# Patient Record
Sex: Male | Born: 1970 | Race: White | Hispanic: No | Marital: Single | Smoking: Never smoker
Health system: Southern US, Community
[De-identification: ages and names within clinical notes are randomized; demographics above are authoritative.]

## PROBLEM LIST (undated history)

## (undated) DIAGNOSIS — I10 Essential (primary) hypertension: Secondary | ICD-10-CM

## (undated) DIAGNOSIS — G4730 Sleep apnea, unspecified: Secondary | ICD-10-CM

## (undated) HISTORY — DX: Sleep apnea, unspecified: G47.30

## (undated) HISTORY — DX: Essential (primary) hypertension: I10

## (undated) HISTORY — PX: FOOT SURGERY: SHX648

---

## 2013-02-06 NOTE — Discharge Instructions (Signed)
Have 4 sutures removed in 5-7 days  Laceration Care, Adult  A laceration is a cut or lesion that goes through all layers of the skin and into the tissue just beneath the skin.  TREATMENT   Some lacerations may not require closure. Some lacerations may not be able to be closed due to an increased risk of infection. It is important to see your caregiver as soon as possible after an injury to minimize the risk of infection and maximize the opportunity for successful closure.  If closure is appropriate, pain medicines may be given, if needed. The wound will be cleaned to help prevent infection. Your caregiver will use stitches (sutures), staples, wound glue (adhesive), or skin adhesive strips to repair the laceration. These tools bring the skin edges together to allow for faster healing and a better cosmetic outcome. However, all wounds will heal with a scar. Once the wound has healed, scarring can be minimized by covering the wound with sunscreen during the day for 1 full year.  HOME CARE INSTRUCTIONS   For sutures or staples:   Keep the wound clean and dry.   If you were given a bandage (dressing), you should change it at least once a day. Also, change the dressing if it becomes wet or dirty, or as directed by your caregiver.   Wash the wound with soap and water 2 times a day. Rinse the wound off with water to remove all soap. Pat the wound dry with a clean towel.   After cleaning, apply a thin layer of the antibiotic ointment as recommended by your caregiver. This will help prevent infection and keep the dressing from sticking.   You may shower as usual after the first 24 hours. Do not soak the wound in water until the sutures are removed.   Only take over-the-counter or prescription medicines for pain, discomfort, or fever as directed by your caregiver.   Get your sutures or staples removed as directed by your caregiver.  For skin adhesive strips:   Keep the wound clean and dry.   Do not get the skin  adhesive strips wet. You may bathe carefully, using caution to keep the wound dry.   If the wound gets wet, pat it dry with a clean towel.   Skin adhesive strips will fall off on their own. You may trim the strips as the wound heals. Do not remove skin adhesive strips that are still stuck to the wound. They will fall off in time.  For wound adhesive:   You may briefly wet your wound in the shower or bath. Do not soak or scrub the wound. Do not swim. Avoid periods of heavy perspiration until the skin adhesive has fallen off on its own. After showering or bathing, gently pat the wound dry with a clean towel.   Do not apply liquid medicine, cream medicine, or ointment medicine to your wound while the skin adhesive is in place. This may loosen the film before your wound is healed.   If a dressing is placed over the wound, be careful not to apply tape directly over the skin adhesive. This may cause the adhesive to be pulled off before the wound is healed.   Avoid prolonged exposure to sunlight or tanning lamps while the skin adhesive is in place. Exposure to ultraviolet light in the first year will darken the scar.   The skin adhesive will usually remain in place for 5 to 10 days, then naturally fall off the skin.  Do not pick at the adhesive film.  You may need a tetanus shot if:   You cannot remember when you had your last tetanus shot.   You have never had a tetanus shot.  If you get a tetanus shot, your arm may swell, get red, and feel warm to the touch. This is common and not a problem. If you need a tetanus shot and you choose not to have one, there is a rare chance of getting tetanus. Sickness from tetanus can be serious.  SEEK MEDICAL CARE IF:    You have redness, swelling, or increasing pain in the wound.   You see a red line that goes away from the wound.   You have yellowish-white fluid (pus) coming from the wound.   You have a fever.   You notice a bad smell coming from the wound or  dressing.   Your wound breaks open before or after sutures have been removed.   You notice something coming out of the wound such as wood or glass.   Your wound is on your hand or foot and you cannot move a finger or toe.  SEEK IMMEDIATE MEDICAL CARE IF:    Your pain is not controlled with prescribed medicine.   You have severe swelling around the wound causing pain and numbness or a change in color in your arm, hand, leg, or foot.   Your wound splits open and starts bleeding.   You have worsening numbness, weakness, or loss of function of any joint around or beyond the wound.   You develop painful lumps near the wound or on the skin anywhere on your body.  MAKE SURE YOU:    Understand these instructions.   Will watch your condition.   Will get help right away if you are not doing well or get worse.  Document Released: 09/21/2005 Document Revised: 12/14/2011 Document Reviewed: 03/17/2011  Exit[employer] Patient Information 2013 [employer].

## 2013-02-06 NOTE — ED Notes (Signed)
Visual acuity complete at this time. PA aware at this time. Pt resting in bed, resp easy. Call light within reach. Bed alarm engaged. Will cont to monitor.     Basilia Jumbo, RN  02/06/13 [phone]

## 2013-02-06 NOTE — ED Provider Notes (Signed)
HPI     [facility] ED  Emergency Department Encounter    Pt Name: Cody Shaw  MRN: [id number]  Birthdate [birth year]  Date of evaluation: 02/06/2013  Provider: Dalene Carrow, PA-C    Chief Complaint   Patient presents with   ??? Laceration     was at work, states air hose popped off and hit him in the eyelid. laceration to right eyelid. no active bleeding       Nursing Notes, Past Medical Hx, Past Surgical Hx, Social Hx, Allergies, and Family Hx were all reviewed and agreed with or any disagreements were addressed in the HPI.    HPI:  (Location, Duration, Timing, Severity, Quality, Assoc Sx, Context, Modifying factors)  This is a  42 y.o. male that presents to the emergency department right after a air hose popped off grazing him and hit him above his eye.  Patient states he had safety glasses on and he states that he got knocked off.  He denies actually anything hitting his eye.  States he is having blurry vision in his right eye due to the amount of tearing and states he has a small amount of pain a 5/10 and a side.  Denies headache, dizziness, nausea, vomiting.  Was driven here by his supervisor.    Past Medical/Surgical History:  History reviewed. No pertinent past medical history.      Procedure Laterality Date   ??? Umbilical hernia repair     ??? Cholecystectomy     ??? Inguinal hernia repair     ??? Knee surgery       left       Medications:  No current facility-administered medications on file prior to encounter.     No current outpatient prescriptions on file prior to encounter.       Review of Systems   Constitutional: Negative for fever and chills.   Eyes: Positive for discharge. Negative for photophobia.   Gastrointestinal: Negative for nausea and vomiting.   Skin: Positive for wound.   Neurological: Negative for headaches.   Remainder of ROS negative as it pertains to the Chief Complaint.      Physical Exam   Nursing note and vitals reviewed.  Constitutional: He is oriented to person, place,  and time. He appears well-developed and well-nourished.   Patient sitting comfortably in bed.  Nontoxic in appearance.   HENT:   Head: Atraumatic.   Right Ear: External ear normal.   Mouth/Throat: Oropharynx is clear and moist. No oropharyngeal exudate.   There is a 2.5 cm linear laceration just below the right eyebrow.   Eyes: EOM are normal. Pupils are equal, round, and reactive to light. Right eye exhibits no discharge. Left eye exhibits no discharge.   Mild clear tearing to the right eye.  No hyphema, hypopyon or injection.  Fluorescein exam reveals no uptake or evidence of abrasion.  Negative Seidel   Neck: Normal range of motion.   Cardiovascular: Normal rate.    Neurological: He is alert and oriented to person, place, and time. No cranial nerve deficit.   Skin: Skin is warm and dry. No rash noted. He is not diaphoretic. No erythema.   Psychiatric: He has a normal mood and affect. His behavior is normal.       Lac Repair  Date/Time: 02/06/2013 11:28 PM  Performed by: Dalene Carrow  Authorized by: Sabino Niemann T  Consent: Verbal consent obtained.  Risks and benefits: risks, benefits and alternatives were discussed  Consent given by: patient  Patient identity confirmed: verbally with patient  Body area: head/neck  Location details: right eyelid  Laceration length: 2.5 cm  Foreign bodies: no foreign bodies  Tendon involvement: none  Nerve involvement: none  Vascular damage: no  Anesthesia: local infiltration  Local anesthetic: lidocaine 1% with epinephrine  Anesthetic total: 4 ml  Patient sedated: no  Preparation: Patient was prepped and draped in the usual sterile fashion.  Irrigation solution: saline  Irrigation method: jet lavage  Amount of cleaning: standard  Debridement: none  Degree of undermining: none  Skin closure: 6-0 nylon  Number of sutures: 4  Technique: simple  Approximation: close  Approximation difficulty: simple  Patient tolerance: Patient tolerated the procedure well with no immediate  complications.        MDM  MEDICAL DECISION MAKING    Vitals:    Filed Vitals:    02/06/13 2159   BP: 150/73   Pulse: 99   Temp: 97.8 ??F (36.6 ??C)   TempSrc: Oral   Resp: 16   Height: 5\' 9"  (1.753 m)   Weight: 242 lb (109.77 kg)   SpO2: 96%       LABS: Labs Reviewed - No data to display     Remainder of labs reviewed and were negative at this time or not returned at the time of this note.    Orders:  Orders Placed This Encounter   Procedures   ??? LACERATION REPAIR   ??? Visual acuity screening       EKG: Interpreted by the attending and myself in the absence of a real-time interpretation by the cardiologist.  N/A    X-RAYS: The attending, Bo Merino, MD, and I have viewed the radiologic plain film image(s).  The plain films will be read or overread by the radiologist.  ALL OTHER NON-PLAIN FILM IMAGES SUCH AS CT, ULTRASOUND AND MRI HAVE BEEN READ BY THE RADIOLOGIST.        No results found.     PROCEDURES: N/A    CRITICAL CARE:  N/A    CONSULTATIONS: N/A    MEDICAL DECISION MAKING / ED COURSE:    Patient was given the following medications:  Medications - No data to display    That presents to the emergency department with a facial laceration blurry vision in his right eye.  As the patient sat here in the emergency department he states his blurry vision had improved.  I do not suspect globe rupture.  There is no evidence of foreign body or corneal abrasion.  Patient is 20/20 bilaterally.  Facial laceration was closed with 4 simple sutures the patient is educated on these removed in 5-7 days.  Patient is neurologically intact.  Patient is stable for outpatient followup and treatment.  He understood his instructions at discharge and is stable at discharge.    The patient tolerated their visit well.  They were seen and evaluated by the attending physician who agreed with the assessment and plan.  The patient and / or the family were informed of the results of any tests, a time was given to answer questions, a plan was  proposed and they agreed with plan.      CLINICAL IMPRESSION:    1. Facial laceration, initial encounter        DISPOSITION Decision to Discharge    PATIENT REFERRED TO:  [facility]  [address]  [place] [address]  [phone]    Schedule an appointment as  soon as possible for a visit in 1 day        DISCHARGE MEDICATIONS:  There are no discharge medications for this patient.      (Please note the MDM and HPI sections of this note were completed with a voice recognition program.  Efforts were made to edit the dictations but occasionally words are mis-transcribed.)    Dalene Carrow, PA-C          Dalene Carrow, PA-C  02/07/13 0401

## 2013-02-06 NOTE — ED Notes (Signed)
Pt resting in bed, resp easy. Call light within reach. Bed alarm engaged. PA at the bedside. Will cont to monitor.     Basilia Jumbo, RN  02/06/13 [phone]

## 2013-02-06 NOTE — ED Provider Notes (Signed)
Patient was struck right above the right eye the symptoms were high-pressure hose. He was wearing glasses and denies that the pressure or the device at the globe. His little tearing of the eye. Pupil appeared equally round to me. We will do fluorescein staining to look for Seidel test. There is no injection to the globe no hyphema. The small laceration above the right eyebrow but no bony tenderness. Normal neurologic exam.      I saw and evaluated this patient shortly after they were seen by the PA/NP 2255    I have personally seen and examined this patient and agree with all pertinent clinical information. I have also reviewed the mid-level provider's documentation and agree with the past medical, family and social history unless otherwise noted.      Bo Merino, MD  02/06/13 2255

## 2013-02-07 ENCOUNTER — Inpatient Hospital Stay: Admit: 2013-02-07 | Discharge: 2013-02-07 | Attending: Emergency Medicine

## 2013-02-07 MED ORDER — LIDOCAINE-EPINEPHRINE 1 %-1:100000 IJ SOLN
1 %-:00000 | INTRAMUSCULAR | Status: DC
Start: 2013-02-07 — End: 2013-02-07

## 2013-02-07 MED ORDER — FLUORESCEIN SODIUM 1 MG OP STRP
1 MG | OPHTHALMIC | Status: DC
Start: 2013-02-07 — End: 2013-02-07

## 2013-02-07 MED ORDER — BSS PLUS IO SOLN
INTRAOCULAR | Status: DC
Start: 2013-02-07 — End: 2013-02-07

## 2013-02-07 MED ORDER — PROPARACAINE HCL 0.5 % OP SOLN
0.5 % | OPHTHALMIC | Status: DC
Start: 2013-02-07 — End: 2013-02-07

## 2013-02-07 MED FILL — PROPARACAINE HCL 0.5 % OP SOLN: 0.5 % | OPHTHALMIC | Qty: 15

## 2013-02-07 MED FILL — LIDOCAINE-EPINEPHRINE 1 %-1:100000 IJ SOLN: 1 %-:00000 | INTRAMUSCULAR | Qty: 20

## 2013-02-07 MED FILL — BSS IO SOLN: INTRAOCULAR | Qty: 15

## 2013-02-07 MED FILL — FUL-GLO 1 MG OP STRP: 1 MG | OPHTHALMIC | Qty: 1

## 2016-01-31 ENCOUNTER — Encounter: Payer: Self-pay | Admitting: Cardiovascular Disease

## 2016-02-10 ENCOUNTER — Ambulatory Visit: Payer: Self-pay | Admitting: Cardiovascular Disease

## 2016-02-12 ENCOUNTER — Encounter: Payer: Self-pay | Admitting: *Deleted

## 2016-02-13 ENCOUNTER — Encounter: Payer: Self-pay | Admitting: *Deleted

## 2016-02-13 ENCOUNTER — Encounter: Payer: Self-pay | Admitting: Cardiovascular Disease

## 2016-02-13 ENCOUNTER — Ambulatory Visit (INDEPENDENT_AMBULATORY_CARE_PROVIDER_SITE_OTHER): Payer: Self-pay | Admitting: Cardiovascular Disease

## 2016-02-13 VITALS — BP 151/87 | HR 77 | Ht 74.0 in | Wt >= 6400 oz

## 2016-02-13 DIAGNOSIS — R635 Abnormal weight gain: Secondary | ICD-10-CM

## 2016-02-13 DIAGNOSIS — I10 Essential (primary) hypertension: Secondary | ICD-10-CM

## 2016-02-13 DIAGNOSIS — R0602 Shortness of breath: Secondary | ICD-10-CM | POA: Insufficient documentation

## 2016-02-13 DIAGNOSIS — R5383 Other fatigue: Secondary | ICD-10-CM

## 2016-02-13 DIAGNOSIS — R42 Dizziness and giddiness: Secondary | ICD-10-CM

## 2016-02-13 MED ORDER — LOSARTAN POTASSIUM 50 MG PO TABS: 75.0000 mg | ORAL_TABLET | Freq: Every day | ORAL | Status: AC

## 2016-02-13 NOTE — Progress Notes (Signed)
Patient ID: Stephen Barton, male   DOB: 06-May-1971, 45 y.o.   MRN: [id number]       CARDIOLOGY CONSULT NOTE  Patient ID: Stephen Barton MRN: [id number] DOB/AGE: February 02, 1971 45 y.o.  Admit date: (Not on file) Primary Physician: Kirstie Peri, MD Referring Physician: Sherryll Burger MD  Reason for Consultation: SOB  HPI: The patient is a 45 year old male with a history of morbid obesity and hypertension who has been referred for the evaluation of shortness of breath.  He weighs 447 pounds. He said that in the last one year he has had a 65-70 pound weight gain. He said he does not eat as much as I would think. He takes his blood pressure medications in the evening. Blood pressure is 151/87 this morning. He has been having progressive exertional dyspnea over the past 8 months. Denies exertional chest pain. Even when showering he experiences such shortness of breath that it makes him dizzy. Denies leg swelling and syncope. Also denies orthopnea and paroxysmal nocturnal dyspnea.  He has a history of intravenous drug use and last used about 3 months ago. He has been in a methadone clinic for the past 7 years.  ECG performed in the office today which I personally interpreted demonstrated sinus rhythm with a diffuse nonspecific T wave abnormality.   No Known Allergies  Current Outpatient Prescriptions  Medication Sig Dispense Refill  . aspirin (ASPIRIN EC) 81 MG EC tablet Take 81 mg by mouth daily. Swallow whole.    . diclofenac (VOLTAREN) 75 MG EC tablet Take 75 mg by mouth 2 (two) times daily.    Marland Kitchen FLUoxetine (PROZAC) 20 MG capsule Take 20 mg by mouth daily.    . furosemide (LASIX) 40 MG tablet Take 40 mg by mouth daily as needed.    . gabapentin (NEURONTIN) 300 MG capsule Take 300 mg by mouth 3 (three) times daily.    Marland Kitchen losartan (COZAAR) 50 MG tablet Take 50 mg by mouth daily.    . 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    . potassium chloride (K-DUR) 10 MEQ tablet Take  10 mEq by mouth daily as needed.    . sildenafil (VIAGRA) 100 MG tablet Take 100 mg by mouth daily as needed for erectile dysfunction.     No current facility-administered medications for this visit.    Past Medical History  Diagnosis Date  . Hypertension   . Sleep apnea     No past surgical history on file.  Social History   Social History  . Marital Status: Single    Spouse Name: N/A  . Number of Children: N/A  . Years of Education: N/A   Occupational History  . Not on file.   Social History Main Topics  . Smoking status: Never Smoker   . Smokeless tobacco: Never Used  . Alcohol Use: Not on file  . Drug Use: Not on file     Comment: goes to a methadone clinic; use to smoke pot and took pills; 07/06/14 smokes mj  . Sexual Activity: Not on file   Other Topics Concern  . Not on file   Social History Narrative     No family history of premature CAD in 1st degree relatives.  Prior to Admission medications   Medication Sig Start Date End Date Taking? Authorizing Provider  citalopram (CELEXA) 40 MG tablet Take 40 mg by mouth daily.    Historical Provider, MD  diclofenac (VOLTAREN) 75 MG EC tablet Take 75  mg by mouth 2 (two) times daily.    Historical Provider, MD  furosemide (LASIX) 40 MG tablet Take 40 mg by mouth daily as needed.    Historical Provider, MD  gabapentin (NEURONTIN) 300 MG capsule Take 300 mg by mouth 3 (three) times daily.    Historical Provider, MD  losartan (COZAAR) 50 MG tablet Take 50 mg by mouth daily.    Historical Provider, MD  methadone (METHADOSE) 40 MG disintegrating tablet Take 40 mg by mouth daily.    Historical Provider, MD  potassium chloride (K-DUR) 10 MEQ tablet Take 10 mEq by mouth daily as needed.    Historical Provider, MD  sildenafil (VIAGRA) 100 MG tablet Take 100 mg by mouth daily as needed for erectile dysfunction.    Historical Provider, MD     Review of systems complete and found to be negative unless listed above in  HPI     Physical exam Blood pressure 151/87, pulse 77, height 6\' 2"  (1.88 m), weight 447 lb (202.758 kg). General: NAD Neck: Unable to assess. Lungs: Clear to auscultation bilaterally with normal respiratory effort. CV: Nondisplaced PMI. Regular rate and rhythm, normal S1/S2, no S3/S4, no murmur.  No peripheral edema.  No carotid bruit.    Abdomen: Morbidly obese.  Skin: Intact without lesions or rashes.  Neurologic: Alert and oriented x 3.  Psych: Normal affect. Extremities: No clubbing or cyanosis.  HEENT: Normal.   ECG: Most recent ECG reviewed.  Labs:  No results found for: WBC, HGB, HCT, MCV, PLT No results for input(s): NA, K, CL, CO2, BUN, CREATININE, CALCIUM, PROT, BILITOT, ALKPHOS, ALT, AST, GLUCOSE in the last 168 hours.  Invalid input(s): LABALBU No results found for: CKTOTAL, CKMB, CKMBINDEX, TROPONINI No results found for: CHOL No results found for: HDL No results found for: LDLCALC No results found for: TRIG No results found for: CHOLHDL No results found for: LDLDIRECT       Studies: No results found.  ASSESSMENT AND PLAN:  1. Shortness of breath and fatigue: Likely due to obesity hypoventilation syndrome. I will order a 2-D echocardiogram with Doppler to evaluate cardiac structure, function, and regional wall motion. I will proceed with a nuclear myocardial perfusion imaging study (2-day Lexiscan) to evaluate for ischemic heart disease.  2. Morbid obesity: Should consider bariatric surgery. Will check TSH for rapid weight gain in past year.  3. Essential HTN: Given morbid obesity, he may have hypertensive heart disease as well. I will order a 2-D echocardiogram with Doppler to evaluate cardiac structure, function, and regional wall motion. BP is elevated. Will increase losartan to 75 mg daily and have him take it every morning.  Dispo: fu 6 weeks.  Signed: Prentice DockerSuresh Koneswaran, M.D., F.A.C.C.  02/13/2016, 10:52 AM

## 2016-02-13 NOTE — Patient Instructions (Addendum)
   Lab for TSH - order given today. Your physician has requested that you have an echocardiogram. Echocardiography is a painless test that uses sound waves to create images of your heart. It provides your doctor with information about the size and shape of your heart and how well your heart's chambers and valves are working. This procedure takes approximately one hour. There are no restrictions for this procedure. Your physician has requested that you have a lexiscan myoview. For further information please visit www.cardiosmart.org. Please follow [email or web address] sheet, as given.  (2 DAY TEST) Office will contact with results via phone or letter.   Increase Losartan to 75mg  every morning -[facility]. Continue all other medications.   Follow up in  6 weeks.

## 2016-02-13 NOTE — Addendum Note (Signed)
Addended by: Lesle ChrisHILL, Xana Bradt G on: 02/13/2016 01:09 PM   Modules accepted: Orders

## 2016-02-13 NOTE — Addendum Note (Signed)
Addended by: Lesle ChrisHILL, Newt Levingston G on: 02/13/2016 11:44 AM   Modules accepted: Orders

## 2016-02-18 ENCOUNTER — Encounter: Payer: Self-pay | Admitting: *Deleted

## 2016-02-19 ENCOUNTER — Encounter (HOSPITAL_COMMUNITY): Admission: RE | Admit: 2016-02-19 | Payer: Self-pay | Source: Ambulatory Visit

## 2016-02-20 ENCOUNTER — Encounter (HOSPITAL_COMMUNITY): Payer: Self-pay

## 2016-02-20 ENCOUNTER — Ambulatory Visit (HOSPITAL_COMMUNITY): Payer: Self-pay

## 2016-02-26 ENCOUNTER — Encounter: Payer: Self-pay | Admitting: *Deleted

## 2016-03-11 ENCOUNTER — Telehealth (HOSPITAL_COMMUNITY): Payer: Self-pay | Admitting: *Deleted

## 2016-03-11 NOTE — Telephone Encounter (Signed)
Patient given detailed instructions per Myocardial Perfusion Study Information Sheet for the test on 03/16/16. Patient notified to arrive 15 minutes early and that it is imperative to arrive on time for appointment to keep from having the test rescheduled.  If you need to cancel or reschedule your appointment, please call the office within 24 hours of your appointment. Failure to do so may result in a cancellation of your appointment, and a $50 no show fee. Patient verbalized understanding. Mousa Prout J Brandyn Lowrey, RN  

## 2016-03-16 ENCOUNTER — Other Ambulatory Visit: Payer: Self-pay

## 2016-03-16 ENCOUNTER — Ambulatory Visit (HOSPITAL_COMMUNITY): Payer: Self-pay | Attending: Cardiovascular Disease

## 2016-03-16 ENCOUNTER — Ambulatory Visit (HOSPITAL_BASED_OUTPATIENT_CLINIC_OR_DEPARTMENT_OTHER): Payer: Self-pay

## 2016-03-16 DIAGNOSIS — R42 Dizziness and giddiness: Secondary | ICD-10-CM

## 2016-03-16 DIAGNOSIS — I517 Cardiomegaly: Secondary | ICD-10-CM | POA: Insufficient documentation

## 2016-03-16 DIAGNOSIS — R0602 Shortness of breath: Secondary | ICD-10-CM

## 2016-03-16 LAB — ECHOCARDIOGRAM COMPLETE
CHL CUP DOP CALC LVOT VTI: 24.8 cm
E decel time: 261 msec
EERAT: 4.82
IV/PV OW: 1.07
LA diam end sys: 53 mm
LA diam index: 1.57 cm/m2
LA vol A4C: 72.3 ml
LASIZE: 53 mm
LV E/e'average: 4.82
LV TDI E'LATERAL: 15.2
LV e' LATERAL: 15.2 cm/s
LVEEMED: 4.82
LVOT peak grad rest: 6 mmHg
LVOTPV: 123 cm/s
Lateral S' vel: 16.1 cm/s
MV Dec: 261
MV pk E vel: 73.2 m/s
MVPG: 2 mmHg
MVPKAVEL: 78.1 m/s
PW: 10.5 mm — AB (ref 0.6–1.1)
RV TAPSE: 24.6 mm
TDI e' medial: 15.4

## 2016-03-16 MED ORDER — PERFLUTREN LIPID MICROSPHERE
1.0000 mL | INTRAVENOUS | Status: AC | PRN
  Administered 2016-03-16: 2 mL via INTRAVENOUS

## 2016-03-16 MED ORDER — TECHNETIUM TC 99M TETROFOSMIN IV KIT
32.4000 | PACK | Freq: Once | INTRAVENOUS | Status: AC | PRN
  Administered 2016-03-16: 32 via INTRAVENOUS
  Filled 2016-03-16: qty 32

## 2016-03-16 MED ORDER — REGADENOSON 0.4 MG/5ML IV SOLN
0.4000 mg | Freq: Once | INTRAVENOUS | Status: AC
  Administered 2016-03-16: 0.4 mg via INTRAVENOUS

## 2016-03-17 ENCOUNTER — Ambulatory Visit (HOSPITAL_COMMUNITY): Payer: Self-pay | Attending: Internal Medicine

## 2016-03-17 ENCOUNTER — Telehealth: Payer: Self-pay | Admitting: *Deleted

## 2016-03-17 LAB — MYOCARDIAL PERFUSION IMAGING
LV dias vol: 160 mL (ref 62–150)
LV sys vol: 61 mL
Peak HR: 91 {beats}/min
RATE: 0.4
Rest HR: 72 {beats}/min
SDS: 2
SRS: 3
SSS: 5
TID: 1.3

## 2016-03-17 MED ORDER — TECHNETIUM TC 99M TETROFOSMIN IV KIT
33.0000 | PACK | Freq: Once | INTRAVENOUS | Status: AC | PRN
  Administered 2016-03-17: 33 via INTRAVENOUS
  Filled 2016-03-17: qty 33

## 2016-03-17 NOTE — Telephone Encounter (Signed)
Notes Recorded by Lesle ChrisAngela G Ara Mano, LPN on 1/61/09606/13/2017 at 9:54 AM Patient notified and verbalized understanding. Copy to pmd. Follow up already scheduled for 03/31/2016.  Notes Recorded by Laqueta LindenSuresh A Koneswaran, MD on 03/16/2016 at 3:55 PM Normal pumping function.

## 2016-03-18 ENCOUNTER — Telehealth: Payer: Self-pay | Admitting: *Deleted

## 2016-03-18 NOTE — Telephone Encounter (Signed)
-----   Message from Stephen PocheJonathan F Branch, MD sent at 03/18/2016 11:16 AM EDT ----- Patient of Dr Kirtland BouchardK, stress test looks good without evidence of blockages. Will discuss in detatil at his f/u with Dr Kirtland BouchardK later this month  Dominga FerryJ Branch MD

## 2016-03-18 NOTE — Telephone Encounter (Signed)
Patient informed. 

## 2016-03-31 ENCOUNTER — Ambulatory Visit: Payer: Self-pay | Admitting: Cardiovascular Disease

## 2016-04-20 ENCOUNTER — Ambulatory Visit (INDEPENDENT_AMBULATORY_CARE_PROVIDER_SITE_OTHER): Payer: Self-pay | Admitting: Cardiovascular Disease

## 2016-04-20 ENCOUNTER — Encounter: Payer: Self-pay | Admitting: Cardiovascular Disease

## 2016-04-20 VITALS — BP 168/80 | HR 88 | Ht 74.0 in | Wt >= 6400 oz

## 2016-04-20 DIAGNOSIS — Z136 Encounter for screening for cardiovascular disorders: Secondary | ICD-10-CM

## 2016-04-20 DIAGNOSIS — IMO0001 Reserved for inherently not codable concepts without codable children: Secondary | ICD-10-CM

## 2016-04-20 DIAGNOSIS — Z7182 Exercise counseling: Secondary | ICD-10-CM

## 2016-04-20 DIAGNOSIS — Z7189 Other specified counseling: Secondary | ICD-10-CM

## 2016-04-20 DIAGNOSIS — R0602 Shortness of breath: Secondary | ICD-10-CM

## 2016-04-20 DIAGNOSIS — I10 Essential (primary) hypertension: Secondary | ICD-10-CM

## 2016-04-20 NOTE — Patient Instructions (Signed)
Medication Instructions:  Continue all current medications.  Labwork: NONE  Testing/Procedures: NONE  Follow-Up: AS NEEDED  Any Other Special Instructions Will Be Listed Below (If Applicable).  If you need a refill on your cardiac medications before your next appointment, please call your pharmacy.  

## 2016-04-20 NOTE — Progress Notes (Signed)
Patient ID: Stephen Barton, male   DOB: 05/18/1971, 45 y.o.   MRN: [id number]      SUBJECTIVE: The patient returns for follow-up after undergoing cardiovascular testing performed for the evaluation of SOB. Nuclear stress test 03/16/16 was low risk with normal perfusion. Echocardiogram showed normal left ventricular systolic and diastolic function and normal regional wall motion, EF 55-60%.   Review of Systems: As per "subjective", otherwise negative.  No Known Allergies  Current Outpatient Prescriptions  Medication Sig Dispense Refill  . aspirin (ASPIRIN EC) 81 MG EC tablet Take 81 mg by mouth daily. Swallow whole.    . diclofenac (VOLTAREN) 75 MG EC tablet Take 75 mg by mouth 2 (two) times daily.    Marland Kitchen. FLUoxetine (PROZAC) 20 MG capsule Take 20 mg by mouth daily.    . furosemide (LASIX) 40 MG tablet Take 40 mg by mouth daily as needed.    . gabapentin (NEURONTIN) 300 MG capsule Take 300 mg by mouth 3 (three) times daily.    Marland Kitchen. losartan (COZAAR) 50 MG tablet Take 1.5 tablets (75 mg total) by mouth daily. 45 tablet 6  . 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    . potassium chloride (K-DUR) 10 MEQ tablet Take 10 mEq by mouth daily as needed.    . sildenafil (VIAGRA) 100 MG tablet Take 100 mg by mouth daily as needed for erectile dysfunction.     No current facility-administered medications for this visit.    Past Medical History  Diagnosis Date  . Hypertension   . Sleep apnea     Past Surgical History  Procedure Laterality Date  . Foot surgery      X's    Social History   Social History  . Marital Status: Single    Spouse Name: N/A  . Number of Children: N/A  . Years of Education: N/A   Occupational History  . Not on file.   Social History Main Topics  . Smoking status: Never Smoker   . Smokeless tobacco: Never Used  . Alcohol Use: Not on file  . 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 Sexual Activity: Not on file   Other Topics Concern  . Not on file   Social History Narrative     Filed Vitals:   04/20/16 1615  BP: 168/80  Pulse: 88  Height: 6\' 2"  (1.88 m)  Weight: 424 lb (192.325 kg)  SpO2: 93%    PHYSICAL EXAM General: NAD Neck: Unable to assess. Lungs: Clear to auscultation bilaterally with normal respiratory effort. CV: Nondisplaced PMI. Regular rate and rhythm, normal S1/S2, no S3/S4, no murmur. No peripheral edema. No carotid bruit.  Abdomen: Morbidly obese.  Skin: Intact without lesions or rashes.  Neurologic: Alert and oriented x 3.  Psych: Normal affect. Extremities: No clubbing or cyanosis.  HEENT: Normal.     ECG: Most recent ECG reviewed.      ASSESSMENT AND PLAN: 1. Shortness of breath and fatigue: Likely due to obesity hypoventilation syndrome. Normal cardiac stress test and LV function.  2. Morbid obesity: Should consider bariatric surgery. TSH was normal. Exercise counseling provided.  3. Essential HTN: BP is elevated but he ran out of losartan.  Dispo: fu prn.   Prentice DockerSuresh Koneswaran, M.D., F.A.C.C.

## 2017-02-17 ENCOUNTER — Encounter: Admit: 2017-02-17

## 2017-02-17 ENCOUNTER — Encounter

## 2017-02-17 ENCOUNTER — Inpatient Hospital Stay: Admit: 2017-02-17 | Discharge: 2017-02-18 | Source: Home / Self Care | Admitting: Emergency Medicine

## 2017-02-17 DIAGNOSIS — R0789 Other chest pain: Secondary | ICD-10-CM

## 2017-02-17 LAB — CBC WITH AUTO DIFFERENTIAL
Basophils %: 0.6 %
Basophils Absolute: 0 10*3/uL (ref 0.0–0.2)
Eosinophils %: 0.9 %
Eosinophils Absolute: 0.1 10*3/uL (ref 0.0–0.6)
Hematocrit: 46.7 % (ref 40.5–52.5)
Hemoglobin: 16.7 g/dL (ref 13.5–17.5)
Lymphocytes %: 22.5 %
Lymphocytes Absolute: 1.6 10*3/uL (ref 1.0–5.1)
MCH: 32 pg (ref 26.0–34.0)
MCHC: 35.9 g/dL (ref 31.0–36.0)
MCV: 89.1 fL (ref 80.0–100.0)
MPV: 8 fL (ref 5.0–10.5)
Monocytes %: 8.8 %
Monocytes Absolute: 0.6 10*3/uL (ref 0.0–1.3)
Neutrophils %: 67.2 %
Neutrophils Absolute: 4.8 10*3/uL (ref 1.7–7.7)
Platelets: 241 10*3/uL (ref 135–450)
RBC: 5.24 M/uL (ref 4.20–5.90)
RDW: 12.8 % (ref 12.4–15.4)
WBC: 7.2 10*3/uL (ref 4.0–11.0)

## 2017-02-17 LAB — COMPREHENSIVE METABOLIC PANEL
ALT: 38 U/L (ref 10–40)
AST: 22 U/L (ref 15–37)
Albumin/Globulin Ratio: 1.2 (ref 1.1–2.2)
Albumin: 4.6 g/dL (ref 3.4–5.0)
Alkaline Phosphatase: 54 U/L (ref 40–129)
Anion Gap: 10 (ref 3–16)
BUN: 11 mg/dL (ref 7–20)
CO2: 27 mmol/L (ref 21–32)
Calcium: 9.6 mg/dL (ref 8.3–10.6)
Chloride: 101 mmol/L (ref 99–110)
Creatinine: 1 mg/dL (ref 0.9–1.3)
GFR African American: >60 (ref >60)
GFR Non-African American: >60 (ref >60)
Globulin: 3.7 g/dL
Glucose: 105 mg/dL — ABNORMAL HIGH (ref 70–99)
Potassium: 4.3 mmol/L (ref 3.5–5.1)
Sodium: 138 mmol/L (ref 136–145)
Total Bilirubin: 0.7 mg/dL (ref 0.0–1.0)
Total Protein: 8.3 g/dL — ABNORMAL HIGH (ref 6.4–8.2)

## 2017-02-17 LAB — PROTIME-INR
INR: 0.97 (ref 0.85–1.15)
Protime: 11 s (ref 9.6–13.0)

## 2017-02-17 LAB — TROPONIN
Troponin: <0.01 ng/mL (ref <0.01)
Troponin: <0.01 ng/mL (ref <0.01)
Troponin: <0.01 ng/mL (ref <0.01)

## 2017-02-17 LAB — BRAIN NATRIURETIC PEPTIDE: Pro-BNP: 11 pg/mL (ref 0–124)

## 2017-02-17 LAB — APTT: aPTT: 29.5 s (ref 24.1–34.9)

## 2017-02-17 MED ORDER — IOPAMIDOL 76 % IV SOLN
76 % | Freq: Once | INTRAVENOUS | Status: AC | PRN
Start: 2017-02-17 — End: 2017-02-17
  Administered 2017-02-17: 17:00:00 75 mL via INTRAVENOUS

## 2017-02-17 MED ORDER — SODIUM CHLORIDE 0.9 % IV BOLUS
0.9 % | Freq: Once | INTRAVENOUS | Status: AC
Start: 2017-02-17 — End: 2017-02-17
  Administered 2017-02-17: 17:00:00 1000 mL via INTRAVENOUS

## 2017-02-17 MED ORDER — ASPIRIN 81 MG PO CHEW
81 MG | Freq: Once | ORAL | Status: AC
Start: 2017-02-17 — End: 2017-02-17
  Administered 2017-02-17: 16:00:00 324 mg via ORAL

## 2017-02-17 MED ORDER — MAGNESIUM HYDROXIDE 400 MG/5ML PO SUSP
400 MG/5ML | Freq: Every day | ORAL | Status: DC | PRN
Start: 2017-02-17 — End: 2017-02-18

## 2017-02-17 MED ORDER — NITROGLYCERIN 0.4 MG SL SUBL
0.4 MG | SUBLINGUAL | Status: DC | PRN
Start: 2017-02-17 — End: 2017-02-18

## 2017-02-17 MED ORDER — NORMAL SALINE FLUSH 0.9 % IV SOLN
0.9 % | INTRAVENOUS | Status: DC | PRN
Start: 2017-02-17 — End: 2017-02-18

## 2017-02-17 MED ORDER — ONDANSETRON HCL 4 MG/2ML IJ SOLN
4 MG/2ML | Freq: Four times a day (QID) | INTRAMUSCULAR | Status: DC | PRN
Start: 2017-02-17 — End: 2017-02-18

## 2017-02-17 MED ORDER — ENOXAPARIN SODIUM 40 MG/0.4ML SC SOLN
40 MG/0.4ML | Freq: Every evening | SUBCUTANEOUS | Status: DC
Start: 2017-02-17 — End: 2017-02-18
  Administered 2017-02-18: 40 mg via SUBCUTANEOUS

## 2017-02-17 MED ORDER — SODIUM CHLORIDE 0.9 % IV SOLN
0.9 % | INTRAVENOUS | Status: AC
Start: 2017-02-17 — End: 2017-02-18

## 2017-02-17 MED ORDER — NORMAL SALINE FLUSH 0.9 % IV SOLN
0.9 % | Freq: Two times a day (BID) | INTRAVENOUS | Status: DC
Start: 2017-02-17 — End: 2017-02-18
  Administered 2017-02-18: 10 mL via INTRAVENOUS

## 2017-02-17 MED ORDER — NITROGLYCERIN 2 % TD OINT
2 % | Freq: Four times a day (QID) | TRANSDERMAL | Status: DC
Start: 2017-02-17 — End: 2017-02-18

## 2017-02-17 MED ORDER — MORPHINE SULFATE 2 MG/ML IJ SOLN
2 MG/ML | INTRAMUSCULAR | Status: DC | PRN
Start: 2017-02-17 — End: 2017-02-18

## 2017-02-17 MED ORDER — ACETAMINOPHEN 325 MG PO TABS
325 MG | ORAL | Status: DC | PRN
Start: 2017-02-17 — End: 2017-02-18
  Administered 2017-02-17 (×2): 650 mg via ORAL

## 2017-02-17 MED ORDER — ACETAMINOPHEN 325 MG PO TABS
325 MG | ORAL | Status: AC
Start: 2017-02-17 — End: 2017-02-18

## 2017-02-17 MED ORDER — ASPIRIN 81 MG PO CHEW
81 MG | Freq: Every day | ORAL | Status: DC
Start: 2017-02-17 — End: 2017-02-18

## 2017-02-17 MED ORDER — NITROGLYCERIN 2 % TD OINT
2 % | Freq: Once | TRANSDERMAL | Status: AC
Start: 2017-02-17 — End: 2017-02-17
  Administered 2017-02-17: 17:00:00 1 [in_us] via TOPICAL

## 2017-02-17 MED FILL — ACETAMINOPHEN 325 MG PO TABS: 325 MG | ORAL | Qty: 2

## 2017-02-17 MED FILL — NITRO-BID 2 % TD OINT: 2 % | TRANSDERMAL | Qty: 1

## 2017-02-17 MED FILL — SODIUM CHLORIDE 0.9 % IV SOLN: 0.9 % | INTRAVENOUS | Qty: 1000

## 2017-02-17 MED FILL — ASPIRIN 81 MG PO CHEW: 81 MG | ORAL | Qty: 4

## 2017-02-17 NOTE — ED Notes (Signed)
Pt reports dizziness upon standing and noted bigeminy while heart rate elevated to 110s.  Upon return bigemeny resolved.      Venita LickLeslie A Skyler Carel, RN  02/17/17 1157

## 2017-02-17 NOTE — ED Notes (Signed)
Pharmacy Medication History Note      List of current medications patient is taking is complete.    Source of information: patient    Changes made to medication list:  Medications flagged for removal (include reason, ex. noncompliance):   N/A    Medications removed (include reason, ex. therapy complete or physician discontinued):   N/A    Medications added/doses adjusted:   N/A    Other notes (ex. Recent course of antibiotics, Coumadin dosing):   Denies use of other OTC or herbal medications.    Last dose times updated.   Kathryn Brace CPHT

## 2017-02-17 NOTE — ED Provider Notes (Signed)
[facility] ED  eMERGENCY dEPARTMENT eNCOUnter        Pt Name: Cody Shaw  MRN: [id number]  Birthdate [birth year]  Date of evaluation: 02/17/2017  Provider: Verdon Cummins, PA  PCP: No primary care provider on file.  ED Attending: No att. providers found    CHIEF COMPLAINT       Chief Complaint   Patient presents with   . 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 never had this happen before       HISTORY OF PRESENT ILLNESS   (Location/Symptom, Timing/Onset, Context/Setting, Quality, Duration, Modifying Factors, Severity)  Note limiting factors.     [name] is a 46 y.o. male with no significant past medical history who presents ED with lightheadedness.  Patient states he was at work and was at a mandatory work meeting when he states he started to feel lightheaded.  Patient states he had a pressure-like sensation rated 6/10 to his substernal chest.  Denies radiation to his symptoms.  States became diaphoretic and felt short of breath.  Patient states that he felt nauseous but had no emesis.  Denies abdominal pain, headache, dizziness, fever/chills, cough, pleuritic chest pain, orthopnea, pedal edema or calf tenderness.  Patient denies any history of similar symptoms in the past.  Patient states symptoms have improved upon arrival to the emergency department.  Patient states apparently his blood pressure was elevated per EMS and states she's never had a history of elevated blood pressure the past.  Denies history of hypertension, hyperlipidemia or diabetes.  Patient states not a smoker.  States does have a significant family history of heart disease through both grandparents and father who also live passed away from multiple heart attacks.  Patient states he never been evaluated for heart disease in the past.     Nursing Notes were all reviewed and agreed  with or any disagreements were addressed  in the HPI.    REVIEW OF SYSTEMS    (2-9 systems for level 4, 10 or more for level 5)     Review of Systems   Constitutional: Positive for diaphoresis. Negative for activity change, appetite change, chills and fever.   Eyes: Negative for photophobia and visual disturbance.   Respiratory: Positive for shortness of breath. Negative for cough.    Cardiovascular: Positive for chest pain. Negative for palpitations and leg swelling.   Gastrointestinal: Positive for nausea. Negative for abdominal pain, constipation, diarrhea and vomiting.   Genitourinary: Negative for difficulty urinating and dysuria.   Musculoskeletal: Negative for arthralgias, myalgias, neck pain and neck stiffness.   Skin: Negative for color change, pallor, rash and wound.   Neurological: Positive for light-headedness. Negative for dizziness, tremors, seizures, syncope, facial asymmetry, speech difficulty, weakness, numbness and headaches.       Positives and Pertinent negatives as per HPI.  Except as noted above in the ROS, all other systems were reviewed and negative.       PAST MEDICAL HISTORY   No past medical history on file.      SURGICAL HISTORY       Past Surgical History:   Procedure Laterality Date   . CHOLECYSTECTOMY     . INGUINAL HERNIA REPAIR     . KNEE SURGERY      left   . UMBILICAL HERNIA REPAIR  CURRENT MEDICATIONS       Previous Medications    No medications on file         ALLERGIES     Patient has no known allergies.    FAMILY HISTORY     No family history on file.       SOCIAL HISTORY       Social History     Social History   . Marital status: Divorced     Spouse name: N/A   . Number of children: N/A   . Years of education: N/A     Social History Main Topics   . Smoking status: Never Smoker   . Smokeless tobacco: Not on file   . Alcohol use No   . Drug use: No   . Sexual activity: Not on file     Other Topics Concern   . Not on file     Social History Narrative   . No narrative on  file       SCREENINGS      Heart Score for chest pain patients  History: Moderately Suspicious  ECG: Non-Specifc repolarization disturbance/LBTB/PM  Patient Age: > 45 and < 65 years  *Risk factors for Atherosclerotic disease: Positive family History, Hypertension  Risk Factors: 1 or 2 risk factors  Troponin: < 1X normal limit  Heart Score Total: 4      PHYSICAL EXAM    (up to 7 for level 4, 8 or more for level 5)     ED Triage Vitals [02/17/17 1043]   BP Temp Temp src Pulse Resp SpO2 Height Weight   (!) 189/103 98.2 F (36.8 C) -- 90 18 99 % -- --       Physical Exam   Constitutional: He is oriented to person, place, and time. He appears well-developed and well-nourished. No distress.   HENT:   Head: Normocephalic and atraumatic.   Right Ear: External ear normal.   Left Ear: External ear normal.   Mouth/Throat: Oropharynx is clear and moist. No oropharyngeal exudate.   Eyes: EOM are normal. Right eye exhibits no discharge. Left eye exhibits no discharge.   Neck: Normal range of motion. Neck supple.   Cardiovascular: Normal rate, regular rhythm, normal heart sounds and intact distal pulses.  Exam reveals no gallop and no friction rub.    No murmur heard.  2+ radial pulses bilaterally.  No pedal edema.  No calf tenderness.  No JVD.   Pulmonary/Chest: Effort normal and breath sounds normal. No stridor. No respiratory distress. He has no wheezes. He has no rales. He exhibits no tenderness.   Abdominal: Soft. He exhibits no distension. There is no tenderness. There is no rebound and no guarding.   Musculoskeletal: Normal range of motion.   Lymphadenopathy:     He has no cervical adenopathy.   Neurological: He is alert and oriented to person, place, and time. He has normal strength. No cranial nerve deficit or sensory deficit. Gait normal. GCS eye subscore is 4. GCS verbal subscore is 5. GCS motor subscore is 6.   Skin: Skin is warm and dry. No rash noted. He is not diaphoretic. No erythema. No pallor.   Psychiatric: He  has a normal mood and affect. His behavior is normal.       DIAGNOSTIC RESULTS   LABS:    Labs Reviewed   COMPREHENSIVE METABOLIC PANEL - Abnormal; Notable for the following:        Result Value    Glucose  105 (*)     Total Protein 8.3 (*)     All other components within normal limits    Narrative:     Performed at:  [facility]  [address],  [place], [place] [address]   Phone [phone]   CBC WITH AUTO DIFFERENTIAL    Narrative:     Performed at:  [facility]  [address],  [place], [place] [address]   Phone [phone]   PROTIME-INR    Narrative:     Performed at:  [facility]  [address],  [place], [place] [address]   Phone [phone]   APTT    Narrative:     Performed at:  [facility]  [address],  [place], [place] [address]   Phone [phone]   TROPONIN    Narrative:     Performed at:  [facility]  [address]  [place], [place] [address]   Phone [phone]   BRAIN NATRIURETIC PEPTIDE    Narrative:     Performed at:  [facility]  [address],  [place], [place] [address]   Phone [phone]       All other labs were within normal range or not returned as of this dictation.    EKG: All EKG's are interpreted by the Emergency Department Physician who either signs or Co-signs this chart in the absence of a cardiologist.  Please see their note for interpretation of EKG.      RADIOLOGY:   Non-plain film images such as CT, Ultrasound and MRI are read by the radiologist. Plain radiographic images are visualized and preliminarily interpreted by the  ED Provider with the below findings:        Interpretation per the Radiologist below, if available at the time of this note:    CTA PULMONARY W CONTRAST   Final Result   No evidence of pulmonary embolism or acute pulmonary abnormality.         XR CHEST STANDARD (2 VW)   Final  Result   No acute cardiopulmonary pathology.           Xr Chest Standard (2 Vw)    Result Date: 02/17/2017  EXAMINATION: TWO VIEWS OF THE CHEST 02/17/2017 10:31 am COMPARISON: None. 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 never had this happen before) Acuity: Unknown Type of Encounter: Unknown FINDINGS: Frontal and lateral views of the chest are submitted for review.  The cardiac silhouette is normal in size.  Lung parenchyma is clear without focal airspace consolidation, sizeable pleural effusion, or pneumothorax.  Trachea is midline.  Osseous structures and soft tissues are grossly intact.     No acute cardiopulmonary pathology.         PROCEDURES   Unless otherwise noted below, none     Procedures    CRITICAL CARE TIME   N/A    CONSULTS:  IP CONSULT TO HOSPITALIST      EMERGENCY DEPARTMENT COURSE and DIFFERENTIAL DIAGNOSIS/MDM:   Vitals:    Vitals:    02/17/17 1152 02/17/17 1300 02/17/17 1330 02/17/17 1400   BP: (!) 182/101 (!) 159/101 (!) 163/93 (!) 151/77   Pulse: 104 82  108 91   Resp: 18 22 23 27    Temp:       SpO2:           Patient was given the following medications:  Medications   sodium chloride 0.9 % infusion (not administered)   aspirin chewable tablet 324 mg (324 mg Oral Given 02/17/17 1154)   iopamidol (ISOVUE-370) 76 % injection 75 mL (75 mLs Intravenous Given 02/17/17 1233)   0.9 % sodium chloride bolus (1,000 mLs Intravenous New Bag 02/17/17 1321)   nitroglycerin (NITRO-BID) 2 % ointment 1 inch (1 inch Topical Given 02/17/17 1321)       Patient is a 46 year old male who presents to ED with complaint of chest discomfort, lightheadedness, nausea, shortness of breath and becoming diaphoretic.  Patient states symptoms occurred prior to arrival.  States feeling  better states still feeling lightheaded and nauseous.  EKG showed nonspecific changes.  CBC unremarkable.  CMP unremarkable.  Troponin and coags unremarkable.  BNP unremarkable.  Chest x-ray unremarkable.  CTA obtained and was also unremarkable.  Given patient's history and physical examination suffering from chest discomfort with significant history of family cardiac disease.  Given patient's history with no previous workup believe patient would benefit from admission for further evaluation and treatment.  Heart score of 4 here in the emergency department and do not believe can be safely cleared from ACS standpoint this time.  I would benefit from admission for further evaluation and treatment.  Case discussed with on-call hospitalist, Dr. Karie MainlandAli, who agreed to admit the patient for further evaluation treatment.  Low suspicion for PE, dissection, AAA, pneumonia, pneumothorax, respiratory distress, GERD, anxiety, CHF, COPD, asthma, surgical abdomen or other emergent etiology at this time.  Given full dose aspirin and fluids here in the ED.      The patient tolerated their visit well.  They were seen and evaluated by the attending physician who agreed with the assessment and plan.  The patient and / or the family were informed of the results of any tests, a time was given to answer questions, a plan was proposed and they agreed with plan.        FINAL IMPRESSION      1. Chest discomfort          DISPOSITION/PLAN   DISPOSITION Admitted 02/17/2017 01:44:12 PM      PATIENT REFERRED TO:  No follow-up provider specified.    DISCHARGE MEDICATIONS:  New Prescriptions    No medications on file       DISCONTINUED MEDICATIONS:  Discontinued Medications    No medications on file              (Please note that portions of this note were completed with a voice recognition program.  Efforts were made to edit the dictations but occasionally words are mis-transcribed.)    Verdon CumminsBenjamin J Afifa Truax, PA (electronically signed)          Isabella StallingBenjamin  Lemmie Vanlanen, PA  02/17/17 1446

## 2017-02-17 NOTE — ED Provider Notes (Signed)
[facility] Emergency Department      Pt Name: Cody Shaw  MRN: [id number]  Birthdate [birth year]  Date of evaluation: 02/17/2017  Provider: Sidonie Dickens, MD  I independently performed a history and physical on Cody Shaw.   All diagnostic, treatment, and disposition decisions were made by myself in conjunction with the advanced practice provider.     HPI: Cody Shaw presented with   Chief Complaint   Patient presents with   . 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 never had this happen before     Cody Shaw has no past medical history on file.  He has a past surgical history that includes Umbilical hernia repair; Cholecystectomy; Inguinal hernia repair; and knee surgery.    No current facility-administered medications on file prior to encounter.      No current outpatient prescriptions on file prior to encounter.     PHYSICAL EXAM  Vitals: BP (!) 189/103   Pulse 90   Temp 98.2 F (36.8 C)   Resp 18   SpO2 99%   Constitutional:  46 y.o. male nontoxic, alert  HENT:  Atraumatic, oral mucosa moist  Neck:  No visible JVD, supple  Chest/Lungs:  Respiratory effort normal, clear, regular  Abdomen:  Non-distended, soft, NT  Back:  No gross deformity  Extremities:  Normal tone and perfusion, no edema or tenderness    Medical Decision Making and Plan: Briefly, this is an 46 y.o.male who presented with dizziness, hypertension, chest discomfort.  No prior hx of hypertension treated with medicine. Diagnostics as below.  Plan is to admit for further care.     For further details of Cody Shaw Emergency Department encounter, please see documentation by advanced practice provider Ronna Polio, PA.    Labs Reviewed   COMPREHENSIVE METABOLIC PANEL - Abnormal; Notable for the following:        Result Value    Glucose 105 (*)     Total Protein 8.3 (*)     All other  components within normal limits    Narrative:     Performed at:  [facility]  [address]  [place], [place] [address]   Phone [phone]   CBC WITH AUTO DIFFERENTIAL    Narrative:     Performed at:  [facility]  [address],  [place], [place] [address]   Phone [phone]   PROTIME-INR    Narrative:     Performed at:  [facility]  [address]  [place], [place] [address]   Phone [phone]   APTT    Narrative:     Performed at:  [facility]  [address],  [place], [place] [address]   Phone [phone]   TROPONIN    Narrative:     Performed at:  [facility]  [address],  [place], [place] [address]   Phone [phone]   BRAIN NATRIURETIC PEPTIDE    Narrative:     Performed at:  [facility]  [address],  [place], [place] [address]   Phone [phone]     Vitals:    02/17/17 1043 02/17/17 1151 02/17/17 1152 02/17/17 1300   BP: (!) 189/103  (!) 182/101 (!) 159/101   Pulse:  90 107 104 82   Resp: 18 23 18 22    Temp: 98.2 F (36.8 C)      SpO2: 99%        RADIOLOGY:   Plain x-rays were viewed by me: Xr Chest Standard (2 Vw)    Result Date: 02/17/2017  EXAMINATION: TWO VIEWS OF THE CHEST 02/17/2017 10:31 am COMPARISON: None. 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 never had this happen before) Acuity: Unknown Type of Encounter: Unknown FINDINGS: Frontal and lateral views of the chest are submitted for review.  The cardiac silhouette is normal in size.  Lung parenchyma is clear without focal airspace consolidation, sizeable pleural effusion, or pneumothorax.  Trachea is midline.  Osseous  structures and soft tissues are grossly intact.     No acute cardiopulmonary pathology.      EKG:  Read by me in the absence of a cardiologist shows:  Sinus rhythm, rate 92, frequent PVCs, intervals normal, axis 52, nonspecific ST-T wave abnormality, no prior EKG available     Medications administered:  Medications   0.9 % sodium chloride bolus (1,000 mLs Intravenous New Bag 02/17/17 1321)   sodium chloride 0.9 % infusion (not administered)   aspirin chewable tablet 324 mg (324 mg Oral Given 02/17/17 1154)   iopamidol (ISOVUE-370) 76 % injection 75 mL (75 mLs Intravenous Given 02/17/17 1233)   nitroglycerin (NITRO-BID) 2 % ointment 1 inch (1 inch Topical Given 02/17/17 1321)     FINAL IMPRESSION:    1. Chest discomfort       DISPOSITION Admitted 02/17/2017 01:44:12 PM     Bobie Kistler Al PimpleAlison Aundre Hietala, MD  02/17/17 1401

## 2017-02-17 NOTE — Progress Notes (Signed)
Shift assessment completed.  VSS.  Pt A&O x4.  Denies chest pain or SOB at this time.  POC reviewed, all questions answered. Personal belongings and call light within reach.  Will continue to monitor.

## 2017-02-17 NOTE — H&P (Signed)
Hospital Medicine History & Physical      PCP: No primary care provider on file.    Date of Admission: 02/17/2017    Date of Service: Pt seen/examined Placed in Observation.    Chief Complaint:  cp      History Of Present Illness:      46 y.o. male who presented to [facility] with cp and dizziness. Reports feeling off and unwell for past week. Had chest tightness today. Some diaphoresis. Felt fatigue. Some nausea and dyspnea. Occurred while in a class.  Admits to tremendous stress. Noted to have elevated blood pressure    Past Medical History:      No past medical history on file.    Past Surgical History:          Procedure Laterality Date   . CHOLECYSTECTOMY     . INGUINAL HERNIA REPAIR     . KNEE SURGERY      left   . UMBILICAL HERNIA REPAIR         Medications Prior to Admission:      Prior to Admission medications    Not on File       Allergies:  Patient has no known allergies.    Social History:      The patient currently lives with family.    TOBACCO:   reports that he has never smoked. He does not have any smokeless tobacco history on file.  ETOH:   reports that he does not drink alcohol.      Family History:      Reviewed in detail and negative for DM, CAD, Cancer, CVA. Positive as follows:    No family history on file.    REVIEW OF SYSTEMS:   Pertinent positives as noted in the HPI. All other systems reviewed and negative.    PHYSICAL EXAM PERFORMED:    BP 131/83   Pulse 88   Temp 98.1 F (36.7 C)   Resp 18   Ht 5\' 9"  (1.753 m)   Wt 242 lb (109.8 kg)   SpO2 98%   BMI 35.74 kg/m     General appearance:  No apparent distress, appears stated age and cooperative.  HEENT:  Normal cephalic, atraumatic without obvious deformity. Pupils equal, round, and reactive to light.  Extra ocular muscles intact. Conjunctivae/corneas clear.  Neck: Supple, with full range of motion. No jugular venous distention. Trachea midline.  Respiratory:  Normal respiratory effort. Clear to auscultation, bilaterally without  Rales/Wheezes/Rhonchi.  Cardiovascular:  Regular rate and rhythm with normal S1/S2 without murmurs, rubs or gallops.  Abdomen: Soft, non-tender, non-distended with normal bowel sounds.  Musculoskeletal:  No clubbing, cyanosis or edema bilaterally.  Full range of motion without deformity.  Skin: Skin color, texture, turgor normal.  No rashes or lesions.  Neurologic:  Neurovascularly intact without any focal sensory/motor deficits. Cranial nerves: II-XII intact, grossly non-focal.  Psychiatric:  Alert and oriented, thought content appropriate, normal insight  Capillary Refill: Brisk,< 3 seconds   Peripheral Pulses: +2 palpable, equal bilaterally       Labs:     Recent Labs      02/17/17   1045   WBC  7.2   HGB  16.7   HCT  46.7   PLT  241     Recent Labs      02/17/17   1045   NA  138   K  4.3   CL  101   CO2  27   BUN  11   CREATININE  1.0   CALCIUM  9.6     Recent Labs      02/17/17   1045   AST  22   ALT  38   BILITOT  0.7   ALKPHOS  54     Recent Labs      02/17/17   1044   INR  0.97     Recent Labs      02/17/17   1045  02/17/17   1519  02/17/17   1905   TROPONINI  <0.01  <0.01  <0.01       Urinalysis:    No results found for: NITRU, WBCUA, BACTERIA, RBCUA, BLOODU, SPECGRAV, GLUCOSEU    Radiology:     CXR: I have reviewed the CXR with the following interpretation: reviewed by me  EKG:  I have reviewed the EKG with the following interpretation: NSR, no ST T wave Qs    CTA PULMONARY W CONTRAST   Final Result   No evidence of pulmonary embolism or acute pulmonary abnormality.         XR CHEST STANDARD (2 VW)   Final Result   No acute cardiopulmonary pathology.         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 for the opportunity to be involved in this patient's care. If you have any questions or concerns please feel free to contact me at [phone].

## 2017-02-17 NOTE — Progress Notes (Signed)
Pt. To room 302 from ED via wheelchair.  Pt. VSS, patient c/o chest tightness, declines medication, patient oriented to room.  Pt. Updated on POC, denies questions. Pt. Given toiletries, denies further needs.      Medium fall risk, call light and bedside table within reach.  Will continue to monitor.    Glean SalenJennifer K Dariel Pellecchia

## 2017-02-17 NOTE — Progress Notes (Signed)
Rn made aware of pt's return from CT

## 2017-02-18 LAB — ECHOCARDIOGRAM COMPLETE 2D W DOPPLER W COLOR: Left Ventricular Ejection Fraction: 55

## 2017-02-18 LAB — LIPID, FASTING
Cholesterol, Fasting: 214 mg/dL — ABNORMAL HIGH (ref 0–199)
HDL: 34 mg/dL — ABNORMAL LOW (ref 40–60)
Triglyceride, Fasting: 404 mg/dL — ABNORMAL HIGH (ref 0–150)

## 2017-02-18 LAB — CBC WITH AUTO DIFFERENTIAL
Basophils %: 0.8 %
Basophils Absolute: 0 10*3/uL (ref 0.0–0.2)
Eosinophils %: 1.6 %
Eosinophils Absolute: 0.1 10*3/uL (ref 0.0–0.6)
Hematocrit: 44.4 % (ref 40.5–52.5)
Hemoglobin: 15.6 g/dL (ref 13.5–17.5)
Lymphocytes %: 30.8 %
Lymphocytes Absolute: 1.9 10*3/uL (ref 1.0–5.1)
MCH: 31.7 pg (ref 26.0–34.0)
MCHC: 35.2 g/dL (ref 31.0–36.0)
MCV: 90 fL (ref 80.0–100.0)
MPV: 7.6 fL (ref 5.0–10.5)
Monocytes %: 10.3 %
Monocytes Absolute: 0.6 10*3/uL (ref 0.0–1.3)
Neutrophils %: 56.5 %
Neutrophils Absolute: 3.5 10*3/uL (ref 1.7–7.7)
Platelets: 219 10*3/uL (ref 135–450)
RBC: 4.94 M/uL (ref 4.20–5.90)
RDW: 12.5 % (ref 12.4–15.4)
WBC: 6.2 10*3/uL (ref 4.0–11.0)

## 2017-02-18 LAB — LDL CHOLESTEROL, DIRECT: LDL Direct: 118 mg/dL — ABNORMAL HIGH (ref <100)

## 2017-02-18 LAB — COMPREHENSIVE METABOLIC PANEL W/ REFLEX TO MG FOR LOW K
ALT: 33 U/L (ref 10–40)
AST: 19 U/L (ref 15–37)
Albumin/Globulin Ratio: 1.2 (ref 1.1–2.2)
Albumin: 4.1 g/dL (ref 3.4–5.0)
Alkaline Phosphatase: 46 U/L (ref 40–129)
Anion Gap: 11 (ref 3–16)
BUN: 11 mg/dL (ref 7–20)
CO2: 28 mmol/L (ref 21–32)
Calcium: 8.9 mg/dL (ref 8.3–10.6)
Chloride: 102 mmol/L (ref 99–110)
Creatinine: 0.9 mg/dL (ref 0.9–1.3)
GFR African American: >60 (ref >60)
GFR Non-African American: >60 (ref >60)
Globulin: 3.3 g/dL
Glucose: 107 mg/dL — ABNORMAL HIGH (ref 70–99)
Potassium reflex Magnesium: 4.2 mmol/L (ref 3.5–5.1)
Sodium: 141 mmol/L (ref 136–145)
Total Bilirubin: 0.6 mg/dL (ref 0.0–1.0)
Total Protein: 7.4 g/dL (ref 6.4–8.2)

## 2017-02-18 LAB — EJECTION FRACTION PERCENTAGE: Left Ventricular Ejection Fraction: 55 %

## 2017-02-18 LAB — NM MYOCARDIAL SPECT REST EXERCISE OR RX: Left Ventricular Ejection Fraction: 56

## 2017-02-18 MED ORDER — ATORVASTATIN CALCIUM 10 MG PO TABS
10 | ORAL_TABLET | Freq: Every day | ORAL | 3 refills | Status: AC
Start: 2017-02-18 — End: ?

## 2017-02-18 MED ORDER — HYDROCHLOROTHIAZIDE 25 MG PO TABS
25 MG | ORAL_TABLET | Freq: Every day | ORAL | 3 refills | Status: AC
Start: 2017-02-18 — End: ?

## 2017-02-18 MED ORDER — TECHNETIUM TC 99M TETROFOSMIN IV KIT
Freq: Once | INTRAVENOUS | Status: AC | PRN
Start: 2017-02-18 — End: 2017-02-18
  Administered 2017-02-18: 13:00:00 30 via INTRAVENOUS

## 2017-02-18 MED ORDER — TECHNETIUM TC 99M TETROFOSMIN IV KIT
Freq: Once | INTRAVENOUS | Status: AC | PRN
Start: 2017-02-18 — End: 2017-02-18
  Administered 2017-02-18: 12:00:00 10 via INTRAVENOUS

## 2017-02-18 MED ORDER — NORMAL SALINE FLUSH 0.9 % IV SOLN
0.9 | INTRAVENOUS | Status: AC
Start: 2017-02-18 — End: 2017-02-18

## 2017-02-18 MED ORDER — SODIUM CHLORIDE 0.9 % IV SOLN
0.9 | INTRAVENOUS | Status: AC
Start: 2017-02-18 — End: 2017-02-18

## 2017-02-18 MED FILL — SODIUM CHLORIDE 0.9 % IV SOLN: 0.9 % | INTRAVENOUS | Qty: 1000

## 2017-02-18 MED FILL — ENOXAPARIN SODIUM 40 MG/0.4ML SC SOLN: 40 MG/0.4ML | SUBCUTANEOUS | Qty: 0.4

## 2017-02-18 MED FILL — SODIUM CHLORIDE FLUSH 0.9 % IV SOLN: 0.9 % | INTRAVENOUS | Qty: 10

## 2017-02-18 NOTE — Progress Notes (Signed)
Stress test completed. Pt. Tolerated well. Chest was 3/10 while on treadmill.  Pain slowly decreased to 2/10 while in recovery.   Bridgett LarssonStephen Kwadwo Taras RN.

## 2017-02-18 NOTE — Progress Notes (Signed)
Data- discharge order received, pt verbalized agreement to discharge, disposition to previous residence, no needs for HHC/DME.     Action- discharge instructions prepared and given to pt, pt verbalized understanding. Medication information packet given r/t NEW and/or CHANGED prescriptions emphasizing name/purpose/side effects, pt verbalized understanding. Discharge instruction summary: Diet- general, Activity- as tolerated, Primary Care Physician as follows: No primary care provider on file. None f/u appointment pt to make appt independently, immunizations reviewed and updated, prescription medications filled pt sent with paper prescription.  Response- Pt belongings gathered, IV removed. Disposition is home (no HHC/DME needs), transported with family, taken to lobby via self, pt independent, no complications.

## 2017-02-18 NOTE — Progress Notes (Signed)
Pt off the floor at this time for stress test.

## 2017-02-18 NOTE — Progress Notes (Signed)
Patient instructed on Bruce Protocol Stress Test Procedure including possible side effects and adverse reactions.  Verbalizes knowledge and understanding and denies having any questions. Daivd Fredericksen RN

## 2017-02-18 NOTE — Plan of Care (Signed)
Problem: Cardiovascular  Goal: Hemodynamic stability  Outcome: Ongoing  VSS.  No c/o of chest pain so far this shift.  Stress and ECHO in AM.  Will continue to monitor.

## 2017-02-18 NOTE — Discharge Instructions (Signed)
.   Good nutrition is important when healing from an illness, injury, or surgery.  Follow any nutrition recommendations given to you during your hospital stay.   . If you were given an oral nutrition supplement while in the hospital, continue to take this supplement at home.  You can take it with meals, in-between meals, and/or before bedtime. These supplements can be purchased at most local grocery stores, pharmacies, and chain super-stores.   . If you have any questions about your diet or nutrition, call the hospital and ask for the dietitian.

## 2017-02-18 NOTE — Care Coordination-Inpatient (Signed)
SW discussed pt with bedside RN during morning huddles.  Pt is in need of assessment from Public Benefits Specialist.  Consult has been placed.  SW contacted [employer] Specialist, Zella BallRobin [phone] to inform of need.     Alfonso PattenSarah Amarys Sliwinski MSW, LSW  [phone]

## 2017-02-18 NOTE — Other (Addendum)
Patient Acct Nbr:  [id number]  Primary AUTH/CERT:    Primary Insurance Company Name:   SELF PAY PLANS  Primary Insurance Plan Name:  SELF PAY  Primary Insurance Group Number:  =  Primary Insurance Plan Type: [employer]  Primary Insurance Policy Number:  [id number]

## 2017-02-18 NOTE — Discharge Instructions (Signed)
Resume home schedule

## 2017-02-18 NOTE — Discharge Summary (Signed)
[facility] DISCHARGE SUMMARY    Patient Demographics    Patient. Jamori Oettinger  Date of Birth. 07/12/1971  MRN. [id number]     Primary care provider. No primary care provider on file.  (Tel: None)    Admit date: 02/17/2017    Discharge date (blank if same as Note Date):   Note Date: 02/18/2017     Reason for Hospitalization.   Chief Complaint   Patient presents with   . 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 never had this happen before           [facility]  Chest pain.     Patient admitted for chest pain. Initial evaluation w as negative for any acute ischemia.  Underwent stress test which showed no acute ischemia  troponin and ekg negative  Pt was discharged stable     HTN  Started on HCTZ    HLd  Started on statin.       Consults.  IP CONSULT TO HOSPITALIST    Physical examination on discharge day.   BP (!) 143/95   Pulse 80   Temp 96.1 F (35.6 C) (Temporal)   Resp 16   Ht 5\' 9"  (1.753 m)   Wt 243 lb 1.6 oz (110.3 kg)   SpO2 97%   BMI 35.90 kg/m   General appearance.  Alert. Looks comfortable.  HEENT. Sclera clear. Moist mucus membranes.  Cardiovascular. Regular rate and rhythm, normal S1, S2. No murmur.   Respiratory. Not using accessory muscles.Clear to auscultation bilaterally, no wheeze.  Gastrointestinal. Abdomen soft, non-tender, not distended, normal bowel sounds  Neurology. Facial symmetry. No speech deficits. Moving all extremities equally.  Extremities. No edema in lower extremities.  Skin. Warm, dry, normal turgor    Condition at time of discharge stable     Medication instructions provided to patient at discharge.     Medication List      START taking these medications    atorvastatin 10 MG tablet  Commonly known as:  LIPITOR  Take 1 tablet by mouth daily  Notes to patient:   Use: lower cholesterol; prevent heart attack/stroke  Side effects: headache, muscle pain/weakness     hydrochlorothiazide 25 MG tablet  Commonly known as:  HYDRODIURIL  Take 1 tablet by mouth daily  Notes to patient:  Use: treat heart failure, fluid retention, lower blood pressure.      Side effects: frequent urination, weakness, muscle cramps, increased sensitivity to light, nausea, and dizziness.           Where to Get Your Medications      You can get these medications from any pharmacy    Bring a paper prescription for each of these medications   atorvastatin 10 MG tablet   hydrochlorothiazide 25 MG tablet         Discharge recommendations given to patient.  Follow Up. pcp in 1 week   Disposition.  home  Activity. activity as tolerated  Diet: DIET GENERAL;      Spent 45  minutes in discharge process.    Signed:  Remi HaggardBhumit Aries Townley, MD     02/18/2017 12:33 PM

## 2017-02-19 LAB — EKG 12-LEAD
Atrial Rate: 92 {beats}/min
P Axis: 58 degrees
P-R Interval: 134 ms
Q-T Interval: 332 ms
QRS Duration: 80 ms
QTc Calculation (Bazett): 410 ms
R Axis: 52 degrees
T Axis: 46 degrees
Ventricular Rate: 92 {beats}/min

## 2018-08-22 ENCOUNTER — Inpatient Hospital Stay (HOSPITAL_COMMUNITY)
Admission: EM | Admit: 2018-08-22 | Discharge: 2018-09-04 | DRG: 871 | Disposition: E | Payer: Self-pay | Attending: Pulmonary Disease | Admitting: Pulmonary Disease

## 2018-08-22 ENCOUNTER — Inpatient Hospital Stay (HOSPITAL_COMMUNITY): Payer: Self-pay

## 2018-08-22 ENCOUNTER — Other Ambulatory Visit: Payer: Self-pay

## 2018-08-22 ENCOUNTER — Emergency Department (HOSPITAL_COMMUNITY): Payer: Self-pay

## 2018-08-22 ENCOUNTER — Encounter (HOSPITAL_COMMUNITY): Payer: Self-pay | Admitting: *Deleted

## 2018-08-22 DIAGNOSIS — G9341 Metabolic encephalopathy: Secondary | ICD-10-CM | POA: Diagnosis present

## 2018-08-22 DIAGNOSIS — Z978 Presence of other specified devices: Secondary | ICD-10-CM

## 2018-08-22 DIAGNOSIS — Z833 Family history of diabetes mellitus: Secondary | ICD-10-CM

## 2018-08-22 DIAGNOSIS — J690 Pneumonitis due to inhalation of food and vomit: Secondary | ICD-10-CM | POA: Diagnosis present

## 2018-08-22 DIAGNOSIS — Z7982 Long term (current) use of aspirin: Secondary | ICD-10-CM

## 2018-08-22 DIAGNOSIS — I10 Essential (primary) hypertension: Secondary | ICD-10-CM | POA: Diagnosis present

## 2018-08-22 DIAGNOSIS — Z66 Do not resuscitate: Secondary | ICD-10-CM | POA: Diagnosis present

## 2018-08-22 DIAGNOSIS — R6521 Severe sepsis with septic shock: Secondary | ICD-10-CM | POA: Diagnosis present

## 2018-08-22 DIAGNOSIS — E162 Hypoglycemia, unspecified: Secondary | ICD-10-CM | POA: Diagnosis present

## 2018-08-22 DIAGNOSIS — N170 Acute kidney failure with tubular necrosis: Secondary | ICD-10-CM | POA: Diagnosis present

## 2018-08-22 DIAGNOSIS — J449 Chronic obstructive pulmonary disease, unspecified: Secondary | ICD-10-CM | POA: Diagnosis present

## 2018-08-22 DIAGNOSIS — A419 Sepsis, unspecified organism: Secondary | ICD-10-CM | POA: Diagnosis present

## 2018-08-22 DIAGNOSIS — Z22322 Carrier or suspected carrier of Methicillin resistant Staphylococcus aureus: Secondary | ICD-10-CM

## 2018-08-22 DIAGNOSIS — R001 Bradycardia, unspecified: Secondary | ICD-10-CM | POA: Diagnosis not present

## 2018-08-22 DIAGNOSIS — K7200 Acute and subacute hepatic failure without coma: Secondary | ICD-10-CM | POA: Diagnosis present

## 2018-08-22 DIAGNOSIS — E872 Acidosis: Secondary | ICD-10-CM | POA: Diagnosis present

## 2018-08-22 DIAGNOSIS — R451 Restlessness and agitation: Secondary | ICD-10-CM | POA: Diagnosis present

## 2018-08-22 DIAGNOSIS — J9601 Acute respiratory failure with hypoxia: Secondary | ICD-10-CM

## 2018-08-22 DIAGNOSIS — J80 Acute respiratory distress syndrome: Secondary | ICD-10-CM | POA: Diagnosis present

## 2018-08-22 DIAGNOSIS — Z79899 Other long term (current) drug therapy: Secondary | ICD-10-CM

## 2018-08-22 DIAGNOSIS — Z8249 Family history of ischemic heart disease and other diseases of the circulatory system: Secondary | ICD-10-CM

## 2018-08-22 DIAGNOSIS — Z8349 Family history of other endocrine, nutritional and metabolic diseases: Secondary | ICD-10-CM

## 2018-08-22 DIAGNOSIS — A4102 Sepsis due to Methicillin resistant Staphylococcus aureus: Principal | ICD-10-CM | POA: Diagnosis present

## 2018-08-22 DIAGNOSIS — E875 Hyperkalemia: Secondary | ICD-10-CM | POA: Diagnosis present

## 2018-08-22 DIAGNOSIS — G4730 Sleep apnea, unspecified: Secondary | ICD-10-CM | POA: Diagnosis present

## 2018-08-22 LAB — CBG MONITORING, ED
GLUCOSE-CAPILLARY: 14 mg/dL — AB (ref 70–99)
GLUCOSE-CAPILLARY: 53 mg/dL — AB (ref 70–99)
GLUCOSE-CAPILLARY: 45 mg/dL — AB (ref 70–99)
Glucose-Capillary: 36 mg/dL — CL (ref 70–99)
Glucose-Capillary: 76 mg/dL (ref 70–99)
Glucose-Capillary: 60 mg/dL — ABNORMAL LOW (ref 70–99)

## 2018-08-22 LAB — BLOOD GAS, ARTERIAL
Acid-base deficit: 25.1 mmol/L — ABNORMAL HIGH (ref 0.0–2.0)
BICARBONATE: 5.5 mmol/L — AB (ref 20.0–28.0)
DRAWN BY: 10551
Drawn by: 398991
FIO2: 100
FIO2: 100
LHR: 24 {breaths}/min
LHR: 24 {breaths}/min
MECHVT: 650 mL
MECHVT: 650 mL
O2 SAT: 97.3 %
O2 SAT: 97.4 %
PATIENT TEMPERATURE: 96.8
PCO2 ART: 45.2 mmHg (ref 32.0–48.0)
PCO2 ART: 32.8 mmHg (ref 32.0–48.0)
PEEP/CPAP: 5 cmH2O
PEEP/CPAP: 5 cmH2O
PO2 ART: 268 mmHg — AB (ref 83.0–108.0)
pH, Arterial: 6.849 — CL (ref 7.350–7.450)
pO2, Arterial: 242 mmHg — ABNORMAL HIGH (ref 83.0–108.0)

## 2018-08-22 LAB — COMPREHENSIVE METABOLIC PANEL
ALBUMIN: 1.6 g/dL — AB (ref 3.5–5.0)
ALK PHOS: 662 U/L — AB (ref 38–126)
ALT: 22 U/L (ref 0–44)
ALT: 32 U/L (ref 0–44)
AST: 76 U/L — AB (ref 15–41)
AST: 169 U/L — AB (ref 15–41)
Albumin: <1 g/dL — ABNORMAL LOW (ref 3.5–5.0)
Alkaline Phosphatase: 802 U/L — ABNORMAL HIGH (ref 38–126)
BILIRUBIN TOTAL: 6.1 mg/dL — AB (ref 0.3–1.2)
BUN: 78 mg/dL — ABNORMAL HIGH (ref 6–20)
BUN: 76 mg/dL — ABNORMAL HIGH (ref 6–20)
CALCIUM: 10 mg/dL (ref 8.9–10.3)
CHLORIDE: 98 mmol/L (ref 98–111)
CO2: <7 mmol/L — ABNORMAL LOW (ref 22–32)
CREATININE: 5.93 mg/dL — AB (ref 0.61–1.24)
Calcium: 7.9 mg/dL — ABNORMAL LOW (ref 8.9–10.3)
Chloride: 99 mmol/L (ref 98–111)
Creatinine, Ser: 5.82 mg/dL — ABNORMAL HIGH (ref 0.61–1.24)
GFR calc Af Amer: 12 mL/min — ABNORMAL LOW (ref >60)
GFR calc non Af Amer: 10 mL/min — ABNORMAL LOW (ref >60)
GFR, EST AFRICAN AMERICAN: 12 mL/min — AB (ref >60)
GFR, EST NON AFRICAN AMERICAN: 10 mL/min — AB (ref >60)
Glucose, Bld: 23 mg/dL — CL (ref 70–99)
Glucose, Bld: 329 mg/dL — ABNORMAL HIGH (ref 70–99)
POTASSIUM: 5.4 mmol/L — AB (ref 3.5–5.1)
Potassium: 7.3 mmol/L (ref 3.5–5.1)
Sodium: 135 mmol/L (ref 135–145)
Sodium: 133 mmol/L — ABNORMAL LOW (ref 135–145)
Total Bilirubin: 4.2 mg/dL — ABNORMAL HIGH (ref 0.3–1.2)
Total Protein: 7.5 g/dL (ref 6.5–8.1)
Total Protein: 5 g/dL — ABNORMAL LOW (ref 6.5–8.1)

## 2018-08-22 LAB — URINALYSIS, ROUTINE W REFLEX MICROSCOPIC
Glucose, UA: 50 mg/dL — AB
KETONES UR: 5 mg/dL — AB
NITRITE: NEGATIVE
PROTEIN: 100 mg/dL — AB
Specific Gravity, Urine: 1.02 (ref 1.005–1.030)
pH: 5 (ref 5.0–8.0)

## 2018-08-22 LAB — CBC
HEMATOCRIT: 34.7 % — AB (ref 39.0–52.0)
HEMOGLOBIN: 9.6 g/dL — AB (ref 13.0–17.0)
MCH: 26.8 pg (ref 26.0–34.0)
MCHC: 27.7 g/dL — ABNORMAL LOW (ref 30.0–36.0)
MCV: 96.9 fL (ref 80.0–100.0)
Platelets: 35 10*3/uL — ABNORMAL LOW (ref 150–400)
RBC: 3.58 MIL/uL — AB (ref 4.22–5.81)
RDW: 16.5 % — ABNORMAL HIGH (ref 11.5–15.5)
WBC: 46 10*3/uL — ABNORMAL HIGH (ref 4.0–10.5)
nRBC: 0.7 % — ABNORMAL HIGH (ref 0.0–0.2)

## 2018-08-22 LAB — RAPID URINE DRUG SCREEN, HOSP PERFORMED
Amphetamines: NOT DETECTED
BENZODIAZEPINES: NOT DETECTED
Barbiturates: NOT DETECTED
COCAINE: NOT DETECTED
OPIATES: POSITIVE — AB
Tetrahydrocannabinol: POSITIVE — AB

## 2018-08-22 LAB — MAGNESIUM: MAGNESIUM: 3.9 mg/dL — AB (ref 1.7–2.4)

## 2018-08-22 LAB — I-STAT TROPONIN, ED: Troponin i, poc: 0.15 ng/mL (ref 0.00–0.08)

## 2018-08-22 LAB — CBC WITH DIFFERENTIAL/PLATELET
BAND NEUTROPHILS: 11 %
Basophils Relative: 0 %
Eosinophils Relative: 0 %
HEMATOCRIT: 46.6 % (ref 39.0–52.0)
HEMOGLOBIN: 13.2 g/dL (ref 13.0–17.0)
LYMPHS PCT: 14 %
MCH: 26.7 pg (ref 26.0–34.0)
MCHC: 28.3 g/dL — ABNORMAL LOW (ref 30.0–36.0)
MCV: 94.1 fL (ref 80.0–100.0)
METAMYELOCYTES PCT: 17 %
Monocytes Relative: 8 %
Myelocytes: 8 %
Neutrophils Relative %: 34 %
Platelets: 67 10*3/uL — ABNORMAL LOW (ref 150–400)
Promyelocytes Relative: 4 %
RBC: 4.95 MIL/uL (ref 4.22–5.81)
RDW: 16.1 % — AB (ref 11.5–15.5)
WBC: 55.4 10*3/uL — AB (ref 4.0–10.5)

## 2018-08-22 LAB — GLUCOSE, CAPILLARY
Glucose-Capillary: 13 mg/dL — CL (ref 70–99)
Glucose-Capillary: <10 mg/dL — CL (ref 70–99)
Glucose-Capillary: 247 mg/dL — ABNORMAL HIGH (ref 70–99)
Glucose-Capillary: 138 mg/dL — ABNORMAL HIGH (ref 70–99)

## 2018-08-22 LAB — DIC (DISSEMINATED INTRAVASCULAR COAGULATION) PANEL
APTT: 81 s — AB (ref 24–36)
PROTHROMBIN TIME: 33.8 s — AB (ref 11.4–15.2)

## 2018-08-22 LAB — TROPONIN I: TROPONIN I: 0.45 ng/mL — AB (ref <0.03)

## 2018-08-22 LAB — I-STAT CHEM 8, ED
BUN: 96 mg/dL — ABNORMAL HIGH (ref 6–20)
CHLORIDE: 106 mmol/L (ref 98–111)
Calcium, Ion: 1.14 mmol/L — ABNORMAL LOW (ref 1.15–1.40)
Creatinine, Ser: 6 mg/dL — ABNORMAL HIGH (ref 0.61–1.24)
Glucose, Bld: <20 mg/dL — CL (ref 70–99)
HEMATOCRIT: 46 % (ref 39.0–52.0)
Hemoglobin: 15.6 g/dL (ref 13.0–17.0)
POTASSIUM: 5.6 mmol/L — AB (ref 3.5–5.1)
SODIUM: 132 mmol/L — AB (ref 135–145)
TCO2: 8 mmol/L — ABNORMAL LOW (ref 22–32)

## 2018-08-22 LAB — CORTISOL: Cortisol, Plasma: 22 ug/dL

## 2018-08-22 LAB — MRSA PCR SCREENING: MRSA by PCR: POSITIVE — AB

## 2018-08-22 LAB — DIC (DISSEMINATED INTRAVASCULAR COAGULATION)PANEL
D-Dimer, Quant: >20 ug/mL-FEU — ABNORMAL HIGH (ref 0.00–0.50)
Fibrinogen: 149 mg/dL — ABNORMAL LOW (ref 210–475)
INR: 3.4
Platelets: 36 10*3/uL — ABNORMAL LOW (ref 150–400)
Smear Review: NONE SEEN

## 2018-08-22 LAB — LACTIC ACID, PLASMA: Lactic Acid, Venous: 23.1 mmol/L (ref 0.5–1.9)

## 2018-08-22 LAB — I-STAT CG4 LACTIC ACID, ED: Lactic Acid, Venous: >17 mmol/L (ref 0.5–1.9)

## 2018-08-22 LAB — PROCALCITONIN: Procalcitonin: 7.75 ng/mL

## 2018-08-22 LAB — APTT: aPTT: 100 seconds — ABNORMAL HIGH (ref 24–36)

## 2018-08-22 LAB — LIPASE, BLOOD: Lipase: 66 U/L — ABNORMAL HIGH (ref 11–51)

## 2018-08-22 MED ORDER — SODIUM CHLORIDE 0.9 % IV BOLUS (SEPSIS)
1000.0000 mL | Freq: Once | INTRAVENOUS | Status: AC
  Administered 2018-08-22: 1000 mL via INTRAVENOUS

## 2018-08-22 MED ORDER — FAMOTIDINE IN NACL 20-0.9 MG/50ML-% IV SOLN
20.0000 mg | INTRAVENOUS | Status: DC
  Filled 2018-08-22: qty 50

## 2018-08-22 MED ORDER — DEXTROSE 50 % IV SOLN
1.0000 | Freq: Once | INTRAVENOUS | Status: AC
  Administered 2018-08-22: 50 mL via INTRAVENOUS

## 2018-08-22 MED ORDER — DEXTROSE 50 % IV SOLN
INTRAVENOUS | Status: AC | PRN
  Administered 2018-08-22: 1 via INTRAVENOUS

## 2018-08-22 MED ORDER — PIPERACILLIN-TAZOBACTAM IN DEX 2-0.25 GM/50ML IV SOLN
2.2500 g | Freq: Four times a day (QID) | INTRAVENOUS | Status: DC
  Filled 2018-08-22 (×4): qty 50

## 2018-08-22 MED ORDER — SODIUM BICARBONATE 8.4 % IV SOLN
INTRAVENOUS | Status: AC
  Administered 2018-08-22: 50 meq
  Filled 2018-08-22: qty 100

## 2018-08-22 MED ORDER — DEXTROSE 50 % IV SOLN: 2.0000 | Freq: Once | INTRAVENOUS | Status: AC

## 2018-08-22 MED ORDER — NOREPINEPHRINE 4 MG/250ML-% IV SOLN
INTRAVENOUS | Status: AC
  Filled 2018-08-22: qty 250

## 2018-08-22 MED ORDER — HEPARIN (PORCINE) 2000 UNITS/L FOR CRRT
INTRAVENOUS_CENTRAL | Status: DC | PRN
  Filled 2018-08-22: qty 1000

## 2018-08-22 MED ORDER — PROPOFOL 10 MG/ML IV BOLUS
INTRAVENOUS | Status: AC
  Administered 2018-08-22: 30 mg
  Filled 2018-08-22: qty 20

## 2018-08-22 MED ORDER — SODIUM BICARBONATE 8.4 % IV SOLN
INTRAVENOUS | Status: AC | PRN
  Administered 2018-08-22: 50 meq via INTRAVENOUS

## 2018-08-22 MED ORDER — STERILE WATER FOR INJECTION IV SOLN
INTRAVENOUS | Status: DC
  Filled 2018-08-22 (×4): qty 150

## 2018-08-22 MED ORDER — FENTANYL CITRATE (PF) 100 MCG/2ML IJ SOLN: 100.0000 ug | INTRAMUSCULAR | Status: DC | PRN

## 2018-08-22 MED ORDER — INSULIN REGULAR(HUMAN) IN NACL 100-0.9 UT/100ML-% IV SOLN
INTRAVENOUS | Status: DC
  Administered 2018-08-22: 0.8 [IU]/h via INTRAVENOUS
  Filled 2018-08-22: qty 100

## 2018-08-22 MED ORDER — SODIUM BICARBONATE 8.4 % IV SOLN
50.0000 meq | Freq: Once | INTRAVENOUS | Status: AC
  Administered 2018-08-22: 50 meq via INTRAVENOUS

## 2018-08-22 MED ORDER — PROPOFOL 1000 MG/100ML IV EMUL
5.0000 ug/kg/min | INTRAVENOUS | Status: DC
  Administered 2018-08-22: 5 ug/kg/min via INTRAVENOUS

## 2018-08-22 MED ORDER — NOREPINEPHRINE 4 MG/250ML-% IV SOLN
0.0000 ug/min | INTRAVENOUS | Status: DC
  Administered 2018-08-22: 5 ug/min via INTRAVENOUS
  Administered 2018-08-22: 35 ug/min via INTRAVENOUS
  Filled 2018-08-22 (×2): qty 250

## 2018-08-22 MED ORDER — ETOMIDATE 2 MG/ML IV SOLN
INTRAVENOUS | Status: AC | PRN
  Administered 2018-08-22: 20 mg via INTRAVENOUS

## 2018-08-22 MED ORDER — VANCOMYCIN HCL 10 G IV SOLR
2000.0000 mg | Freq: Once | INTRAVENOUS | Status: DC
  Filled 2018-08-22: qty 2000

## 2018-08-22 MED ORDER — SODIUM BICARBONATE 8.4 % IV SOLN
INTRAVENOUS | Status: AC
  Filled 2018-08-22: qty 100

## 2018-08-22 MED ORDER — HEPARIN SODIUM (PORCINE) 1000 UNIT/ML DIALYSIS
1000.0000 [IU] | INTRAMUSCULAR | Status: DC | PRN
  Filled 2018-08-22: qty 6

## 2018-08-22 MED ORDER — DEXTROSE 50 % IV SOLN
INTRAVENOUS | Status: AC
  Administered 2018-08-22: 100 mL
  Filled 2018-08-22: qty 50

## 2018-08-22 MED ORDER — EPINEPHRINE PF 1 MG/ML IJ SOLN
0.5000 ug/min | INTRAVENOUS | Status: DC
  Filled 2018-08-22: qty 4

## 2018-08-22 MED ORDER — VANCOMYCIN HCL IN DEXTROSE 1-5 GM/200ML-% IV SOLN
1000.0000 mg | Freq: Once | INTRAVENOUS | Status: AC
  Administered 2018-08-22: 1000 mg via INTRAVENOUS
  Filled 2018-08-22: qty 200

## 2018-08-22 MED ORDER — VANCOMYCIN HCL IN DEXTROSE 1-5 GM/200ML-% IV SOLN
1000.0000 mg | Freq: Once | INTRAVENOUS | Status: DC
  Administered 2018-08-22: 1000 mg via INTRAVENOUS
  Filled 2018-08-22: qty 200

## 2018-08-22 MED ORDER — HEPARIN BOLUS VIA INFUSION (CRRT)
1000.0000 [IU] | INTRAVENOUS | Status: DC | PRN
  Filled 2018-08-22: qty 1000

## 2018-08-22 MED ORDER — ATROPINE SULFATE 1 MG/10ML IJ SOSY
PREFILLED_SYRINGE | INTRAMUSCULAR | Status: AC
  Filled 2018-08-22: qty 10

## 2018-08-22 MED ORDER — HEPARIN SODIUM (PORCINE) 1000 UNIT/ML IJ SOLN
2800.0000 [IU] | Freq: Once | INTRAMUSCULAR | Status: AC
  Administered 2018-08-22: 2800 [IU] via INTRAVENOUS
  Filled 2018-08-22: qty 3

## 2018-08-22 MED ORDER — VASOPRESSIN 20 UNIT/ML IV SOLN
0.0300 [IU]/min | INTRAVENOUS | Status: DC
  Administered 2018-08-22: 0.03 [IU]/min via INTRAVENOUS
  Filled 2018-08-22: qty 2

## 2018-08-22 MED ORDER — DEXTROSE 50 % IV SOLN
INTRAVENOUS | Status: AC
  Administered 2018-08-22: 1
  Filled 2018-08-22: qty 50

## 2018-08-22 MED ORDER — PIPERACILLIN-TAZOBACTAM 3.375 G IVPB 30 MIN
3.3750 g | Freq: Once | INTRAVENOUS | Status: AC
  Administered 2018-08-22: 3.375 g via INTRAVENOUS

## 2018-08-22 MED ORDER — SODIUM CHLORIDE 0.9 % IV BOLUS
500.0000 mL | Freq: Once | INTRAVENOUS | Status: AC
  Administered 2018-08-22: 500 mL via INTRAVENOUS

## 2018-08-22 MED ORDER — STERILE WATER FOR INJECTION IV SOLN
INTRAVENOUS | Status: DC
  Filled 2018-08-22 (×7): qty 150

## 2018-08-22 MED ORDER — VANCOMYCIN HCL IN DEXTROSE 1-5 GM/200ML-% IV SOLN: 1000.0000 mg | INTRAVENOUS | Status: AC

## 2018-08-22 MED ORDER — SODIUM BICARBONATE 8.4 % IV SOLN
INTRAVENOUS | Status: DC
  Administered 2018-08-22 (×2): via INTRAVENOUS
  Filled 2018-08-22 (×5): qty 150

## 2018-08-22 MED ORDER — DEXTROSE 50 % IV SOLN
INTRAVENOUS | Status: AC
  Filled 2018-08-22: qty 50

## 2018-08-22 MED ORDER — DEXTROSE 50 % IV SOLN
INTRAVENOUS | Status: AC
  Filled 2018-08-22: qty 100

## 2018-08-22 MED ORDER — VANCOMYCIN HCL 10 G IV SOLR: 1500.0000 mg | INTRAVENOUS | Status: DC

## 2018-08-22 MED ORDER — SODIUM CHLORIDE 0.9 % IV SOLN
250.0000 [IU]/h | INTRAVENOUS | Status: DC
  Filled 2018-08-22: qty 2

## 2018-08-22 MED ORDER — SODIUM BICARBONATE 8.4 % IV SOLN
100.0000 meq | Freq: Once | INTRAVENOUS | Status: AC
  Administered 2018-08-22: 100 meq via INTRAVENOUS

## 2018-08-22 MED ORDER — PIPERACILLIN-TAZOBACTAM 3.375 G IVPB
3.3750 g | Freq: Once | INTRAVENOUS | Status: DC
  Filled 2018-08-22: qty 50

## 2018-08-22 MED ORDER — PROPOFOL 1000 MG/100ML IV EMUL
INTRAVENOUS | Status: AC
  Administered 2018-08-22: 5 ug/kg/min via INTRAVENOUS
  Filled 2018-08-22: qty 100

## 2018-08-22 MED ORDER — ROCURONIUM BROMIDE 50 MG/5ML IV SOLN
INTRAVENOUS | Status: AC | PRN
  Administered 2018-08-22: 40 mg via INTRAVENOUS

## 2018-08-22 MED ORDER — SUCCINYLCHOLINE CHLORIDE 20 MG/ML IJ SOLN
INTRAMUSCULAR | Status: AC | PRN
  Administered 2018-08-22: 200 mg via INTRAVENOUS

## 2018-08-22 MED ORDER — SODIUM CHLORIDE 0.9 % IV SOLN
2.0000 g | Freq: Once | INTRAVENOUS | Status: AC
  Administered 2018-08-22: 2 g via INTRAVENOUS
  Filled 2018-08-22: qty 20

## 2018-08-22 MED ORDER — PRISMASOL BGK 0/2.5 32-2.5 MEQ/L IV SOLN
INTRAVENOUS | Status: DC
  Filled 2018-08-22 (×6): qty 5000

## 2018-08-22 MED ORDER — SODIUM CHLORIDE 0.9 % IV SOLN: 250.0000 mL | INTRAVENOUS | Status: DC

## 2018-08-22 MED ORDER — EPINEPHRINE PF 1 MG/10ML IJ SOSY
PREFILLED_SYRINGE | INTRAMUSCULAR | Status: AC
  Filled 2018-08-22: qty 10

## 2018-08-23 ENCOUNTER — Telehealth: Payer: Self-pay

## 2018-08-23 LAB — BLOOD CULTURE ID PANEL (REFLEXED)
ACINETOBACTER BAUMANNII: NOT DETECTED
CANDIDA ALBICANS: NOT DETECTED
CANDIDA GLABRATA: NOT DETECTED
Candida krusei: NOT DETECTED
Candida parapsilosis: NOT DETECTED
Candida tropicalis: NOT DETECTED
Carbapenem resistance: NOT DETECTED
ENTEROBACTER CLOACAE COMPLEX: NOT DETECTED
ESCHERICHIA COLI: NOT DETECTED
Enterobacteriaceae species: NOT DETECTED
Enterococcus species: NOT DETECTED
HAEMOPHILUS INFLUENZAE: NOT DETECTED
Klebsiella oxytoca: NOT DETECTED
Klebsiella pneumoniae: NOT DETECTED
Listeria monocytogenes: NOT DETECTED
METHICILLIN RESISTANCE: DETECTED — AB
NEISSERIA MENINGITIDIS: NOT DETECTED
PSEUDOMONAS AERUGINOSA: NOT DETECTED
Proteus species: NOT DETECTED
STREPTOCOCCUS AGALACTIAE: NOT DETECTED
STREPTOCOCCUS PNEUMONIAE: NOT DETECTED
STREPTOCOCCUS PYOGENES: NOT DETECTED
STREPTOCOCCUS SPECIES: NOT DETECTED
Serratia marcescens: NOT DETECTED
Staphylococcus aureus (BCID): DETECTED — AB
Staphylococcus species: DETECTED — AB
Vancomycin resistance: NOT DETECTED

## 2018-08-23 LAB — PATHOLOGIST SMEAR REVIEW

## 2018-08-23 MED FILL — Medication: Qty: 1 | Status: AC

## 2018-08-23 MED FILL — Dextrose Inj 50%: INTRAVENOUS | Qty: 50 | Status: AC

## 2018-08-23 NOTE — Telephone Encounter (Signed)
On 08/23/18 I received a d/c from [facility] [employer] [employer] (original[facility] The d/c is for cremation.  Patient is a patient of Doctor Agarwala.  The d/c will be taken to [address] [place] for signature.  On 08/26/18 I received the d/c back from Doctor Agarwala. I got the d/c ready and called the funeral home to let them know the d/c was ready for pickup. I also faxed the d/c to the funeral home per the funeral home request.

## 2018-08-24 LAB — CULTURE, BLOOD (ROUTINE X 2)
Special Requests: ADEQUATE
Special Requests: ADEQUATE

## 2018-08-24 LAB — URINE CULTURE: Culture: 80000 — AB

## 2018-08-25 LAB — GLUCOSE, CAPILLARY: Glucose-Capillary: <10 mg/dL — CL (ref 70–99)

## 2018-09-04 NOTE — Progress Notes (Signed)
Pharmacy Antibiotic Note  Stephen PaganiniDanny L Strohmeier is a 47 y.o. male admitted on 11/22/17 with altered mental status.  [facility]. He required intubation. We are starting the patient on empiric antibiotics for sepsis with unknown source at this time. He is also in acute renal failure. Scr up to 6 today with LA > 17.   Plan: -Vancomycin 1 g to complete additional 2 g then 1500 mg IV q48h -Zosyn 2.25 g IV q6h -Monitor renal fx, cultures, VR as needed   Height: 6\' 1"  (185.4 cm) Weight: 268 lb 4.8 oz (121.7 kg) IBW/kg (Calculated) : 79.9  Temp (24hrs), Avg:97.2 F (36.2 C), Min:96.5 F (35.8 C), Max:97.9 F (36.6 C)  Recent Labs  Lab 27-Dec-2017 0655 27-Dec-2017 0702  WBC 55.4*  --   CREATININE 5.82* 6.00*  LATICACIDVEN  --  >17.00*     Antimicrobials this admission: 11/18 vancomycin > 11/18 zosyn >  Dose adjustments this admission: N/A  Microbiology results: 11/18 blood cx: 11/18 resp cx: 11/18 urine cx:   Baldemar FridayMasters, Jalik Gellatly M 11/22/17 11:00 AM

## 2018-09-04 NOTE — Procedures (Signed)
Arterial Catheter Insertion Procedure Note Stephen PaganiniDanny L Barton [id number]  Procedure: Insertion of Arterial Catheter  Indications: Blood pressure monitoring  Procedure Details Consent: Unable to obtain consent because of emergent medical necessity. Time Out: Verified patient identification, verified procedure, site/side was marked, verified correct patient position, special equipment/implants available, medications/allergies/relevent history reviewed, required imaging and test results available.  Performed  Maximum sterile technique was used including antiseptics, cap, gloves, gown, hand hygiene, mask and sheet. Skin prep: Chlorhexidine; local anesthetic administered 20 gauge catheter was inserted into left femoral artery using the Seldinger technique. ULTRASOUND GUIDANCE USED: YES       Evaluation Blood flow good; BP tracing good. Complications: No apparent complications.   Daleen BoRavi Khadijatou Borak 31-Jul-2018

## 2018-09-04 NOTE — Progress Notes (Signed)
He is actively dying in spite of 2 pressors, adding epinephrine infusion.  Follow-up lab work and imaging reviewed.  I do not think he will survive the evening even with full supportive measures. Discussed current findings and clinical decline with his mother, based on his hospital course thus far we will make him a DO NOT RESUSCITATE.  Continue current supportive measures including vasoactive drips, empiric antibiotics, dialysis, and mechanical ventilation however should he suffer a cardiac arrest we will not perform CPR, defibrillation, or any other advanced cardiac measures   Simonne MartinetPeter E Chenell Lozon ACNP-BC [facility] Pulmonary/Critical Care Pager # [phone] OR # [phone] if no answer

## 2018-09-04 NOTE — ED Notes (Signed)
Date and time results received: 24-Mar-2018 7:48 AM  (use smartphrase ".now" to insert current time)  Test: ABG Critical Value: pH below reportable, pCO2 45.2, pO2 268, HCO3 below reportable, SaO2 97  Name of Provider Notified: Delo  Orders Received? Or Actions Taken?: Orders Received - See Orders for details

## 2018-09-04 NOTE — ED Notes (Signed)
Repeat cbg 14, pt given second amp D50,

## 2018-09-04 NOTE — H&P (Signed)
NAME:  Stephen PaganiniDanny L Barton, MRN:  [id number], DOB:  05/15/1971, LOS: 0 ADMISSION DATE:  03/21/2018, CONSULTATION DATE:  11/18 REFERRING MD:  Judd Liendelo, CHIEF COMPLAINT:  Septic shock    Brief History   47 year old male patient admitted with acute metabolic encephalopathy, MODS and presumed septic shock on 11/18.  Etiology initially unclear, has had history of left foot surgery, also prior lumbar spine hardware infection remotely.   History of present illness   This is a 47 year old white male who presented initially to the emergency room at [facility] with a chief complaint of altered mental status.  He apparently had been having increased pain particularly in the left leg about 5 days prior to presentation.  This progressed to generalized pain all over and then eventually mental status change first noted on 11/17 for which family summoned EMS but the patient declined transport.  Throughout the evening he became progressively confused, screaming out in pain, and more encephalopathic.  On arrival to the emergency room he was confused, agitated, mottled, cool and cyanotic.  He became progressively hypotensive, then unresponsive, and required endotracheal intubation.  Cultures were obtained, after airway established [facility] and therapy upon arrival the patient was unresponsive with Glasgow Coma Scale of 3, hypotensive with systolic blood pressure in the 70s, glucose was less than 10, and he was mottled. 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  Micro Data:  Blood cultures 11/18 Sputum culture 11/18 Urine culture 11/18   Antimicrobials:  Vancomycin 11/18 Zosyn 11/18  Interim history/subjective:  Remains unresponsive GCS 3  Objective   Blood pressure 97/62, pulse 82, temperature (Abnormal) 96.8 F (36 C), temperature source Axillary, resp. rate (Abnormal) 24, height 6\' 1"  (1.854 m), weight 121.7 kg, SpO2 100 %.    Vent Mode: PRVC FiO2 (%):  [100 %] 100 % Set Rate:  [22 bmp-24 bmp] 24 bmp Vt Set:  [650 mL] 650 mL PEEP:  [5 cmH20] 5 cmH20 Plateau Pressure:  [20 cmH20] 20 cmH20   Intake/Output Summary (Last 24 hours) at 03/21/2018 1123 Last data filed at 03/21/2018 0850 Gross per 24 hour  Intake 2050 ml  Output no documentation  Net 2050 ml   Filed Weights   05-19-18 0713  Weight: 121.7 kg    Examination: General: Critically ill, unresponsive, mottled, currently hypotensive HENT: Orally intubated, pupils unreactive mucous membranes dry Lungs: Scattered rhonchi equal chest rise Cardiovascular: Intermittent atrial fibrillation currently sinus rhythm Abdomen: Soft, mottled, hypoactive, no organomegaly Extremities: Cool, mottled, weak pulses requiring Doppler  Neuro: GCS 3 GU: Minimal urine output  Resolved Hospital Problem list     Assessment & Plan:  Acute metabolic encephalopathy; possibly toxic component  -Suspect sepsis, further complicated by profound acidosis and shock Plan Supportive care PAD protocol RASS goal -1 Treat infection Follow-up CT head Follow-up ABG Correct acidosis   Septic shock source is unclear, I worry about orthopedic etiology such as spine or foot given his history, with associated  bacteremia.  Has what looks like pneumonia as well suspect this is aspiration -has progfo Plan Central access Norepinephrine for mean arterial pressure greater than  65 Holding all antihypertensives and diuretics Pan-culture Broad-spectrum antibiotics currently day #1 Zosyn and vancomycin Check cortisol Treat acidosis, he will need CRRT  Acute hypoxic respiratory failure due to aspiration pneumonia, and what appears to be evolving ARDS Plan Continue full ventilator support Hold off on ARDS due to high Ve needs and acidosis  Sputum culture Follow-up x-ray and ABG  Hypoglycemia -Treated -I worry about accuracy given degree of shock and peripheral vasoconstriction Plan Serial CBGs Ck once via IV to ensure accuracy  May need D10  Acute renal failure  -CT ad/pelvis  Plan Will need CRRT   Profound metabolic acidosis with severe lactic acidosis Plan Follow-up lactate Follow-up ABG Continue current bicarbonate infusion for now Will likely need CRRT if no significant neurological insult identified  Fluid and electrolyte imbalance: hyperkalemia   Plan Needs HD  Shock liver  Plan Repeat LFTs am   Best practice:  Diet: NPO Pain/Anxiety/Delirium protocol (if indicated): 11/18 VAP protocol (if indicated): 11/18 DVT prophylaxis:SCD GI prophylaxis: H2B Glucose control: hourly glucose check Mobility: bedrest Code Status: full code  Family Communication: updated Disposition: critically ill needs cont titration of pressors, close titration of Peep, FIO2 and RR on vent, and close obs and treatment of medical derangements.   Labs   CBC: Recent Labs  Lab Aug 27, 2018 0655 08/27/18 0702  WBC 55.4*  --   HGB 13.2 15.6  HCT 46.6 46.0  MCV 94.1  --   PLT 67*  --     Basic Metabolic Panel: Recent Labs  Lab 08/27/18 0655 August 27, 2018 0702  NA 135 132*  K 5.4* 5.6*  CL 99 106  CO2 <7*  --   GLUCOSE 23* <20*  BUN 78* 96*  CREATININE 5.82* 6.00*  CALCIUM 10.0  --    GFR: Estimated Creatinine Clearance: 20.8 mL/min (A) (by C-G formula based on SCr of 6 mg/dL (H)). Recent Labs  Lab 08-27-2018 0655 27-Aug-2018 0702  WBC 55.4*  --    LATICACIDVEN  --  >17.00*    Liver Function Tests: Recent Labs  Lab 2018/08/27 0655  AST 76*  ALT 22  ALKPHOS 662*  BILITOT 6.1*  PROT 7.5  ALBUMIN 1.6*   No results for input(s): LIPASE, AMYLASE in the last 168 hours. No results for input(s): AMMONIA in the last 168 hours.  ABG    Component Value Date/Time   PHART BELOW REPORTABLE RANGE 08/27/18 0715   PCO2ART 45.2 2018-08-27 0715   PO2ART 268 (H) 08-27-2018 0715   TCO2 8 (L) 2018/08/27 0702   O2SAT 97.3 August 27, 2018 0715     Coagulation Profile: No results for input(s): INR, PROTIME in the last 168 hours.  Cardiac Enzymes: No results for input(s): CKTOTAL, CKMB, CKMBINDEX, TROPONINI in the last 168 hours.  HbA1C: No results found for: HGBA1C  CBG: Recent Labs  Lab 2018/08/27 0757 08/27/18 0825 08-27-2018 0926 08/27/18 1034 2018/08/27 1103  GLUCAP 36* 76 60* <10* 13*    Review of Systems:   na  Past Medical History  He,  has a past medical history of Hypertension and Sleep apnea.   Surgical History    Past Surgical History:  Procedure Laterality Date  . FOOT SURGERY     X's     Social History   reports that he has never smoked. He has never used smokeless tobacco.   Family History  His family history includes Diabetes in his father, maternal grandmother, and mother; Heart attack in his mother; Heart disease in his maternal grandmother; Hyperlipidemia in his mother; Hypertension in his mother; Lung cancer in his father.   Allergies No Known Allergies   Home Medications  Prior to Admission medications   Medication Sig Start Date End Date Taking? Authorizing Provider  aspirin (ASPIRIN EC) 81 MG EC tablet Take 81 mg by mouth daily. Swallow whole.    [provider]  diclofenac (VOLTAREN) 75 MG EC tablet Take 75 mg by mouth 2 (two) times daily.    [provider]  FLUoxetine (PROZAC) 20 MG capsule Take 20 mg by mouth daily.    [provider]  furosemide (LASIX) 40 MG  tablet Take 40 mg by mouth daily as needed.    [provider]  gabapentin (NEURONTIN) 300 MG capsule Take 300 mg by mouth 3 (three) times daily.    [provider]  losartan (COZAAR) 50 MG tablet Take 1.5 tablets (75 mg total) by mouth daily. 02/13/16   Laqueta Linden, MD  methadone (DOLOPHINE) 10 MG/5ML solution Take 160 mg by mouth daily.    [provider]  potassium chloride (K-DUR) 10 MEQ tablet Take 10 mEq by mouth daily as needed.    [provider]  sildenafil (VIAGRA) 100 MG tablet Take 100 mg by mouth daily as needed for erectile dysfunction.    [provider]     Critical care time: 66     Simonne Martinet ACNP-BC [facility] Pulmonary/Critical Care Pager # [phone] OR # [phone] if no answer

## 2018-09-04 NOTE — ED Triage Notes (Signed)
Ems called out to a residence for altered mental status, upon ems arrival pt mottled, slurred speech, bystanders were only able to give vague information, swelling noted to left side of facial area. Friends of pt advised that symptoms started last night.

## 2018-09-04 NOTE — ED Notes (Signed)
Date and time results received: 12/05/2017 0714 (use smartphrase ".now" to insert current time)  Test: trop 0.15 Critical Value:   Name of Provider Notified: Dr Judd Liendelo  Orders Received? Or Actions Taken?: none

## 2018-09-04 NOTE — ED Notes (Addendum)
Date and time results received: 2017/10/18 0714 (use smartphrase ".now" to insert current time)  Test: lactic aci 17.0 Critical Value:   Name of Provider Notified: Dr Judd Lienelo  Orders Received? Or Actions Taken?: orders given,

## 2018-09-04 NOTE — Progress Notes (Signed)
Late entry: At 1645 patient's heart rhythm changed, noticing widening QRS, slower rate, and Dr. Denese KillingsAgarwala notified. Family was called to the bedside. At this time it appeared that the arterial line was not picking up anymore. At 1650, arterial line read 65/29. At approximately 1700, all meds were turned off. Family was present the entire time. At 1706 the monitor showed asystole. Two RNs listened and pronounced time of death.

## 2018-09-04 NOTE — ED Notes (Signed)
Multiple attempts made for iv access with no success on most attempts, 20 gauge iv removed from right ac due to infiltration,

## 2018-09-04 NOTE — Procedures (Signed)
Central Venous Catheter Insertion Procedure Note Desma PaganiniDanny L Ducre [id number] 01/04/1971  Procedure: Insertion of Central Venous Catheter Indications: Drug and/or fluid administration  Procedure Details Consent: Unable to obtain consent because of emergent medical necessity. Time Out: Verified patient identification, verified procedure, site/side was marked, verified correct patient position, special equipment/implants available, medications/allergies/relevent history reviewed, required imaging and test results available.  Performed  Maximum sterile technique was used including antiseptics, cap, gloves, gown, hand hygiene, mask and sheet. Skin prep: Chlorhexidine; local anesthetic administered A antimicrobial bonded/coated double lumen catheter was placed in the left femoral vein due to emergent situation using the Seldinger technique.  13F  20cm Trialysis catheter inserted under ultrasound guidance.      Evaluation Blood flow poor Complications: No apparent complications Patient did tolerate procedure well. Chest X-ray ordered to verify placement.  CXR: N/A.  Miklo Aken 2018/03/26, 2:38 PM

## 2018-09-04 NOTE — Progress Notes (Signed)
Patient to CT for full body scan. Patient tolerated well

## 2018-09-04 NOTE — ED Notes (Signed)
Pt is difficult stick and when access is obtained, begin to infiltrate shortly after.  Dr. Judd Lienelo aware and advised this nurse to attempt again.

## 2018-09-04 NOTE — Consult Note (Signed)
Reason for Consult: Severe metabolic acidosis, hyperkalemia, acute kidney injury Referring Physician: Lynnell Catalan MD (CCM)  HPI:  47 year old Caucasian man with past medical history significant for hypertension, obesity, sleep apnea and unclear renal baseline who was brought to the [facility] emergency room with altered mental status in the preceding 5-day history of increasing pain especially in the left leg.  He was intubated to protect his airway and transferred to [facility] for additional specialized care with hypotension/profound metabolic acidosis and a GCS of 3.  Clinically, he is in septic shock with multiorgan failure-focus of infection unclear.  Past Medical History:  Diagnosis Date  . Hypertension   . Sleep apnea     Past Surgical History:  Procedure Laterality Date  . FOOT SURGERY     X's    Family History  Problem Relation Age of Onset  . Lung cancer Father   . Diabetes Father   . Diabetes Mother   . Heart attack Mother        x's 2  . Hypertension Mother   . Hyperlipidemia Mother   . Heart disease Maternal Grandmother   . Diabetes Maternal Grandmother     Social History:  reports that he has never smoked. He has never used smokeless tobacco. His alcohol and drug histories are not on file.  Allergies: No Known Allergies  Medications:  Scheduled: . atropine      . dextrose      . EPINEPHrine        BMP Latest Ref Rng & Units 2018-09-20 09/20/2018 2018/09/20  Glucose 70 - 99 mg/dL 409(W) <11(BJ) 47(WG)  BUN 6 - 20 mg/dL 95(A) 21(H) 08(M)  Creatinine 0.61 - 1.24 mg/dL 5.78(I) 6.96(E) 9.52(W)  Sodium 135 - 145 mmol/L 133(L) 132(L) 135  Potassium 3.5 - 5.1 mmol/L 7.3(HH) 5.6(H) 5.4(H)  Chloride 98 - 111 mmol/L 98 106 99  CO2 22 - 32 mmol/L <7(L) - <7(L)  Calcium 8.9 - 10.3 mg/dL 7.9(L) - 10.0   CBC Latest Ref Rng & Units 09-20-2018 20-Sep-2018 09/20/2018  WBC 4.0 - 10.5 K/uL 46.0(H) - -  Hemoglobin 13.0 - 17.0 g/dL 4.1(L) - 24.4  Hematocrit 39.0 - 52.0  % 34.7(L) - 46.0  Platelets 150 - 400 K/uL 35(L) 36(L) -     Ct Head Wo Contrast  Result Date: 20-Sep-2018 CLINICAL DATA:  Confusion. EXAM: CT HEAD WITHOUT CONTRAST CT CERVICAL SPINE WITHOUT CONTRAST TECHNIQUE: Multidetector CT imaging of the head and cervical spine was performed following the standard protocol without intravenous contrast. Multiplanar CT image reconstructions of the cervical spine were also generated. COMPARISON:  None. FINDINGS: CT HEAD FINDINGS Brain: Right parietal low density is noted concerning for infarction of indeterminate age or possibly edema related underlying neoplasm. No midline shift is noted. Ventricular size is within normal limits. There is no evidence of hemorrhage. Vascular: No hyperdense vessel or unexpected calcification. Skull: Normal. Negative for fracture or focal lesion. Sinuses/Orbits: No acute finding. Other: None. CT CERVICAL SPINE FINDINGS Alignment: Normal. Skull base and vertebrae: No acute fracture. No primary bone lesion or focal pathologic process. Soft tissues and spinal canal: No prevertebral fluid or swelling. No visible canal hematoma. Disc levels: Mild degenerative disc disease is noted at C5-6 with anterior and posterior osteophyte formation. Upper chest: Multifocal opacities are noted in visualized lung apices concerning for multifocal pneumonia or septic emboli. Other: None. IMPRESSION: Right parietal low density is noted concerning for infarction of indeterminate age or possibly edema related underlying neoplasm. MRI is recommended  for further evaluation. Mild degenerative disc disease is noted at C5-6. No fracture or spondylolisthesis is noted. Multifocal opacities are noted in the visualized portions of lung apices concerning for multifocal pneumonia or septic emboli. Please see report of chest CT performed on same day. Electronically Signed   By: Lupita RaiderJames  Green Jr, M.D.   On: September 01, 2018 15:07   Ct Cervical Spine Wo Contrast  Result Date:  September 01, 2018 CLINICAL DATA:  Confusion. EXAM: CT HEAD WITHOUT CONTRAST CT CERVICAL SPINE WITHOUT CONTRAST TECHNIQUE: Multidetector CT imaging of the head and cervical spine was performed following the standard protocol without intravenous contrast. Multiplanar CT image reconstructions of the cervical spine were also generated. COMPARISON:  None. FINDINGS: CT HEAD FINDINGS Brain: Right parietal low density is noted concerning for infarction of indeterminate age or possibly edema related underlying neoplasm. No midline shift is noted. Ventricular size is within normal limits. There is no evidence of hemorrhage. Vascular: No hyperdense vessel or unexpected calcification. Skull: Normal. Negative for fracture or focal lesion. Sinuses/Orbits: No acute finding. Other: None. CT CERVICAL SPINE FINDINGS Alignment: Normal. Skull base and vertebrae: No acute fracture. No primary bone lesion or focal pathologic process. Soft tissues and spinal canal: No prevertebral fluid or swelling. No visible canal hematoma. Disc levels: Mild degenerative disc disease is noted at C5-6 with anterior and posterior osteophyte formation. Upper chest: Multifocal opacities are noted in visualized lung apices concerning for multifocal pneumonia or septic emboli. Other: None. IMPRESSION: Right parietal low density is noted concerning for infarction of indeterminate age or possibly edema related underlying neoplasm. MRI is recommended for further evaluation. Mild degenerative disc disease is noted at C5-6. No fracture or spondylolisthesis is noted. Multifocal opacities are noted in the visualized portions of lung apices concerning for multifocal pneumonia or septic emboli. Please see report of chest CT performed on same day. Electronically Signed   By: Lupita RaiderJames  Green Jr, M.D.   On: September 01, 2018 15:07   Ct L-spine No Charge  Result Date: September 01, 2018 CLINICAL DATA:  Sepsis. EXAM: CT LUMBAR SPINE WITHOUT CONTRAST TECHNIQUE: Multidetector CT imaging of the  lumbar spine was performed without intravenous contrast administration. Multiplanar CT image reconstructions were also generated. COMPARISON:  Lumbar spine radiographs 08/10/2018 FINDINGS: Segmentation: 5 non rib-bearing lumbar type vertebral bodies are present. The lowest fully formed vertebral body is L5. S1 is partially lumbarized. Alignment: AP alignment is anatomic. Levoconvex curvature of the lumbar spine is centered at T12-L1. Vertebrae: Endplate sclerotic changes are present at L5-S1. Vertebral body heights are normal. No acute or healing fractures are present. Paraspinal and other soft tissues: Atherosclerotic changes are present at the aortic arch. There is no aneurysm. No solid organ lesions are present. Disc levels: T12-L1: Negative. L1-2: Negative. L2-3: Mild facet hypertrophy is worse on the left. Mild foraminal narrowing is evident bilaterally. L3-4: A mild broad-based disc protrusion is present. Moderate facet hypertrophy is noted bilaterally. Mild central and moderate bilateral foraminal stenosis is present. L4-5: A broad-based disc protrusion is asymmetric to the right. Moderate facet hypertrophy is worse on the right. Severe right and moderate left foraminal stenosis is present. L5-S1: A broad-based disc protrusion is present. Advanced facet hypertrophy is noted bilaterally. Moderate central canal stenosis is present. There is severe osseous foraminal narrowing bilaterally. IMPRESSION: 1. 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 2. Mild foraminal narrowing bilaterally at L2-3. 3. Mild central and moderate bilateral foraminal stenosis at L3-4. 4. Severe right and moderate left foraminal stenosis at L4-5 with moderate central  canal stenosis worse on the right. 5. Moderate central and severe foraminal stenosis bilaterally at L5-S1. Electronically Signed   By: Marin Roberts M.D.   On: August 26, 2018 15:38   Dg Chest Portable 1 View  Result Date: 08-26-18 CLINICAL  DATA:  Intubation, endotracheal tube placement EXAM: PORTABLE CHEST 1 VIEW COMPARISON:  Portable exam 0728 hours without priors for comparison FINDINGS: Tip of endotracheal tube projects 6.8 cm above carina. Nasogastric tube extends into stomach. Upper normal heart size. BILATERAL airspace infiltrates greater on RIGHT. No pleural effusion or pneumothorax. Bones unremarkable. IMPRESSION: BILATERAL pulmonary infiltrates RIGHT greater than LEFT most consistent with pneumonia. Electronically Signed   By: Ulyses Southward M.D.   On: 08-26-2018 07:55   Dg Chest Port 1v Same Day  Result Date: 2018/08/26 CLINICAL DATA:  Intubated EXAM: PORTABLE CHEST 1 VIEW COMPARISON:  Chest radiograph from earlier today. FINDINGS: Endotracheal tube tip is 6.3 cm above the carina. Enteric tube enters stomach with the tip not seen on this image. Stable cardiomediastinal silhouette with normal heart size. No pneumothorax. Small right pleural effusion is stable. No left pleural effusion. Extensive patchy opacities throughout the right greater than left lungs, unchanged. IMPRESSION: 1.  Endotracheal tube tip is 6.3 cm above the carina. 2. Enteric tube enters stomach with the tip not seen on this image. 3. Stable small right pleural effusion. 4. Stable extensive patchy opacities throughout the right greater than left lungs, suggesting multilobar pneumonia. Electronically Signed   By: Delbert Phenix M.D.   On: 2018-08-26 09:21    Review of Systems  Unable to perform ROS: Intubated   Blood pressure (!) 71/59, pulse 82, temperature (!) 96.8 F (36 C), temperature source Axillary, resp. rate (!) 0, height 6\' 1"  (1.854 m), weight 121.7 kg, SpO2 100 %. Physical Exam  Nursing note and vitals reviewed. Constitutional: He appears well-developed and well-nourished.  Intubated, unresponsive, mottled extremities noted  HENT:  Head: Normocephalic and atraumatic.  Mouth/Throat: Oropharynx is clear and moist.  Eyes: Pupils are equal, round, and  reactive to light. Conjunctivae and EOM are normal. No scleral icterus.  Neck: Normal range of motion. No JVD present.  Cardiovascular: Normal rate, regular rhythm and normal heart sounds.  No murmur heard. Respiratory: Effort normal and breath sounds normal. He has no wheezes. He has no rales.  GI: Soft. Bowel sounds are normal. There is no tenderness. There is no rebound.  Musculoskeletal: He exhibits no edema.  Neurological:  Unresponsive  Skin:  Cool, clammy, mottled    Assessment/Plan: 1.  Acute kidney injury: Likely ATN of sepsis, profound metabolic acidosis and hyperkalemia along with hemodynamic instability necessitating emergent initiation of CRRT.  Overall picture appears extremely grim.  Will use sodium bicarbonate pre-and post filter and tailor dialysate accordingly. 2.  Sepsis with multiorgan failure: Unclear source/etiology-on broad-spectrum antimicrobial therapy and pressor support with Levophed/vasopressin. 3.  Ventilator dependent respiratory failure: Intubated, ventilator management per CCM. 4.  Profound anion gap metabolic acidosis: With lactic acidosis/acute kidney injury-CRRT 5.  Hyperkalemia: Initiate CRRT for potassium correction. 6.  Metabolic encephalopathy Taahir Grisby K. 26-Aug-2018, 3:42 PM

## 2018-09-04 NOTE — Progress Notes (Signed)
Pt transported to CT and back to 2M03 on vent, no complications

## 2018-09-04 NOTE — ED Notes (Signed)
Date and time results received: 05-11-2018 0723 (use smartphrase ".now" to insert current time)  Test: cbg 14 Critical Value:   Name of Provider Notified: de delo  Orders Received? Or Actions Taken?: orders given,

## 2018-09-04 NOTE — Death Summary Note (Signed)
DEATH SUMMARY   Patient Details  Name: Stephen Barton MRN: [id number] DOB: 02/10/1971  Admission/Discharge Information   Admit Date:  2018/07/25  Date of Death: Date of Death: December 08, 2017  Time of Death: Time of Death: 1706  Length of Stay: 1  Referring Physician: Kirstie PeriShah, Ashish, MD   Reason(s) for Hospitalization  severe septic shock  Diagnoses  Preliminary cause of death: Severe septic shock secondary to methicillin-resistant Staphylococcus aureus bacteremia. Secondary Diagnoses (including complications and co-morbidities):  Active Problems:   Sepsis (HCC)   Septic shock [facility](HCC)   Brief Hospital Course (including significant findings, care, treatment, and services provided and events leading to death)  Stephen Barton is a 47 y.o. year old male who was received in transfer from [facility] where he presented with altered mental status and hypotension.  He was intubated there and transferred with a diagnosis of septic shock.  He was already in renal failure and profoundly acidotic and mottled there.  On arrival here he remained hypotensive in spite of fluid resuscitation.  He was mottled.  He had marked leukocytosis.  He was continued on IV broad-spectrum antibiotics as initiated at [facility], he was given further fluid resuscitation and started on vasopressors.  The plan was to initiate CRRT.  Unfortunately, he had increasing vasopressor requirements and eventually became hypotensive in spite of 3 vasopressors.  He became increasingly bradycardic wide-complex rhythm. Given his poor premorbid health and advanced septic shock it was decided in consultation with the family to forego further aggressive care and the patient expired at 1700 hrs.  Blood cultures have subsequently been reported positive by [facility] for MRSA.   Pertinent Labs and Studies  Significant Diagnostic Studies Ct Abdomen Pelvis Wo Contrast  Result Date: 2018/07/25 CLINICAL DATA:  47 year old male found  unresponsive at home this morning. Reportedly became confused yesterday. IV contrast contraindicated due to renal function at this time. EXAM: CT CHEST, ABDOMEN AND PELVIS WITHOUT CONTRAST TECHNIQUE: Multidetector CT imaging of the chest, abdomen and pelvis was performed following the standard protocol without IV contrast. COMPARISON:  Head and cervical spine CT reported separately. FINDINGS: CT CHEST FINDINGS Cardiovascular: No pericardial effusion. Vascular patency is not evaluated in the absence of IV contrast. Mediastinum/Nodes: Reactive appearing bilateral mediastinal lymph nodes individually up to 14 millimeters short axis. An enteric tube is in place and courses through the esophagus to the abdomen. Lungs/Pleura: Small layering right pleural effusion is suspected. There is superimposed confluent right lower lobe consolidation, with suspicion of some superimposed cavitary changes within the right lower lobe (series 4, image 115). Widespread bilateral abnormal pulmonary opacity elsewhere with numerous irregular nodules throughout both lungs, many of which are cavitary. The right upper lobe and left lung apex are among the most affected. Superimposed apical pulmonary septal thickening. See possible trace left pleural effusion. Endotracheal tube is in place and terminates in good position just below the thoracic inlet. Major airways are patent. No pneumothorax. Musculoskeletal: No acute or destructive osseous lesion identified in the thorax. Age advanced lower thoracic spine degeneration. CT ABDOMEN PELVIS FINDINGS Hepatobiliary: Negative noncontrast liver and gallbladder. Pancreas: Negative. Spleen: Splenomegaly. Splenic volume estimated at 2,400 milliliters (normal splenic volume range 83 - 412 mL). No discrete splenic lesion is identified in the absence of IV contrast. Adrenals/Urinary Tract: Negative adrenal glands. Negative noncontrast kidneys and ureters. The urinary bladder is decompressed by a Foley  catheter. Stomach/Bowel: Decompressed large bowel from the mid transverse colon to the rectum. Mild gas distended right colon and  proximal transverse colon. No bowel wall thickening. Negative appendix. Negative terminal ileum. No dilated small bowel Enteric tube terminates in the gastric body. The stomach and duodenum are decompressed. No abdominal free air, free fluid. Vascular/Lymphatic: Vascular patency is not evaluated in the absence of IV contrast. Mild Aortoiliac calcified atherosclerosis. There are left femoral approach arterial and venous catheters. The venous catheter tip may be situated outside of the vessel (series 3, image 134) and there is a small volume of gas within the left external iliac vein. Reproductive: Negative. Other: No pelvic free fluid. Musculoskeletal: No acute or destructive osseous lesion identified. Age advanced lumbar spine degeneration. IMPRESSION: 1. Abnormal lung appearance is nonspecific but most suggestive of Disseminated Septic Pulmonary Emboli with confluent Right Lower Lobe Pneumonia. Small right pleural effusion. Reactive mediastinal lymphadenopathy. 2. ET tube and enteric tube appear well placed. Left femoral venous and arterial lines and the venous catheter tip may be outside of the vessel (coronal image 87), and there is a small volume of gas within that vein. 3. Marked splenomegaly, nonspecific. 4. Age advanced degenerative changes in the spine. No acute or destructive osseous lesion identified. Electronically Signed   By: Odessa Fleming M.D.   On: 09-12-2018 15:51   Ct Head Wo Contrast  Result Date: September 12, 2018 CLINICAL DATA:  Confusion. EXAM: CT HEAD WITHOUT CONTRAST CT CERVICAL SPINE WITHOUT CONTRAST TECHNIQUE: Multidetector CT imaging of the head and cervical spine was performed following the standard protocol without intravenous contrast. Multiplanar CT image reconstructions of the cervical spine were also generated. COMPARISON:  None. FINDINGS: CT HEAD FINDINGS Brain:  Right parietal low density is noted concerning for infarction of indeterminate age or possibly edema related underlying neoplasm. No midline shift is noted. Ventricular size is within normal limits. There is no evidence of hemorrhage. Vascular: No hyperdense vessel or unexpected calcification. Skull: Normal. Negative for fracture or focal lesion. Sinuses/Orbits: No acute finding. Other: None. CT CERVICAL SPINE FINDINGS Alignment: Normal. Skull base and vertebrae: No acute fracture. No primary bone lesion or focal pathologic process. Soft tissues and spinal canal: No prevertebral fluid or swelling. No visible canal hematoma. Disc levels: Mild degenerative disc disease is noted at C5-6 with anterior and posterior osteophyte formation. Upper chest: Multifocal opacities are noted in visualized lung apices concerning for multifocal pneumonia or septic emboli. Other: None. IMPRESSION: Right parietal low density is noted concerning for infarction of indeterminate age or possibly edema related underlying neoplasm. MRI is recommended for further evaluation. Mild degenerative disc disease is noted at C5-6. No fracture or spondylolisthesis is noted. Multifocal opacities are noted in the visualized portions of lung apices concerning for multifocal pneumonia or septic emboli. Please see report of chest CT performed on same day. Electronically Signed   By: Lupita Raider, M.D.   On: 2018/09/12 15:07   Ct Chest Wo Contrast  Result Date: Sep 12, 2018 CLINICAL DATA:  47 year old male found unresponsive at home this morning. Reportedly became confused yesterday. IV contrast contraindicated due to renal function at this time. EXAM: CT CHEST, ABDOMEN AND PELVIS WITHOUT CONTRAST TECHNIQUE: Multidetector CT imaging of the chest, abdomen and pelvis was performed following the standard protocol without IV contrast. COMPARISON:  Head and cervical spine CT reported separately. FINDINGS: CT CHEST FINDINGS Cardiovascular: No pericardial  effusion. Vascular patency is not evaluated in the absence of IV contrast. Mediastinum/Nodes: Reactive appearing bilateral mediastinal lymph nodes individually up to 14 millimeters short axis. An enteric tube is in place and courses through the esophagus to the abdomen.  Lungs/Pleura: Small layering right pleural effusion is suspected. There is superimposed confluent right lower lobe consolidation, with suspicion of some superimposed cavitary changes within the right lower lobe (series 4, image 115). Widespread bilateral abnormal pulmonary opacity elsewhere with numerous irregular nodules throughout both lungs, many of which are cavitary. The right upper lobe and left lung apex are among the most affected. Superimposed apical pulmonary septal thickening. See possible trace left pleural effusion. Endotracheal tube is in place and terminates in good position just below the thoracic inlet. Major airways are patent. No pneumothorax. Musculoskeletal: No acute or destructive osseous lesion identified in the thorax. Age advanced lower thoracic spine degeneration. CT ABDOMEN PELVIS FINDINGS Hepatobiliary: Negative noncontrast liver and gallbladder. Pancreas: Negative. Spleen: Splenomegaly. Splenic volume estimated at 2,400 milliliters (normal splenic volume range 83 - 412 mL). No discrete splenic lesion is identified in the absence of IV contrast. Adrenals/Urinary Tract: Negative adrenal glands. Negative noncontrast kidneys and ureters. The urinary bladder is decompressed by a Foley catheter. Stomach/Bowel: Decompressed large bowel from the mid transverse colon to the rectum. Mild gas distended right colon and proximal transverse colon. No bowel wall thickening. Negative appendix. Negative terminal ileum. No dilated small bowel Enteric tube terminates in the gastric body. The stomach and duodenum are decompressed. No abdominal free air, free fluid. Vascular/Lymphatic: Vascular patency is not evaluated in the absence of IV  contrast. Mild Aortoiliac calcified atherosclerosis. There are left femoral approach arterial and venous catheters. The venous catheter tip may be situated outside of the vessel (series 3, image 134) and there is a small volume of gas within the left external iliac vein. Reproductive: Negative. Other: No pelvic free fluid. Musculoskeletal: No acute or destructive osseous lesion identified. Age advanced lumbar spine degeneration. IMPRESSION: 1. Abnormal lung appearance is nonspecific but most suggestive of Disseminated Septic Pulmonary Emboli with confluent Right Lower Lobe Pneumonia. Small right pleural effusion. Reactive mediastinal lymphadenopathy. 2. ET tube and enteric tube appear well placed. Left femoral venous and arterial lines and the venous catheter tip may be outside of the vessel (coronal image 87), and there is a small volume of gas within that vein. 3. Marked splenomegaly, nonspecific. 4. Age advanced degenerative changes in the spine. No acute or destructive osseous lesion identified. Electronically Signed   By: Odessa Fleming M.D.   On: 09-02-18 15:51   Ct Cervical Spine Wo Contrast  Result Date: 09/02/18 CLINICAL DATA:  Confusion. EXAM: CT HEAD WITHOUT CONTRAST CT CERVICAL SPINE WITHOUT CONTRAST TECHNIQUE: Multidetector CT imaging of the head and cervical spine was performed following the standard protocol without intravenous contrast. Multiplanar CT image reconstructions of the cervical spine were also generated. COMPARISON:  None. FINDINGS: CT HEAD FINDINGS Brain: Right parietal low density is noted concerning for infarction of indeterminate age or possibly edema related underlying neoplasm. No midline shift is noted. Ventricular size is within normal limits. There is no evidence of hemorrhage. Vascular: No hyperdense vessel or unexpected calcification. Skull: Normal. Negative for fracture or focal lesion. Sinuses/Orbits: No acute finding. Other: None. CT CERVICAL SPINE FINDINGS Alignment:  Normal. Skull base and vertebrae: No acute fracture. No primary bone lesion or focal pathologic process. Soft tissues and spinal canal: No prevertebral fluid or swelling. No visible canal hematoma. Disc levels: Mild degenerative disc disease is noted at C5-6 with anterior and posterior osteophyte formation. Upper chest: Multifocal opacities are noted in visualized lung apices concerning for multifocal pneumonia or septic emboli. Other: None. IMPRESSION: Right parietal low density is noted concerning for infarction  of indeterminate age or possibly edema related underlying neoplasm. MRI is recommended for further evaluation. Mild degenerative disc disease is noted at C5-6. No fracture or spondylolisthesis is noted. Multifocal opacities are noted in the visualized portions of lung apices concerning for multifocal pneumonia or septic emboli. Please see report of chest CT performed on same day. Electronically Signed   By: Lupita Raider, M.D.   On: 08-25-18 15:07   Ct Foot Left Wo Contrast  Result Date: 2018-08-25 CLINICAL DATA:  Sepsis.  Nonresponsive. EXAM: CT OF THE LEFT FOOT WITHOUT CONTRAST TECHNIQUE: Multidetector CT imaging of the left foot was performed according to the standard protocol. Multiplanar CT image reconstructions were also generated. COMPARISON:  Foot radiographs from 08/10/2018 FINDINGS: Bones/Joint/Cartilage Hardware failure of the talar screws associated with the dorsal tibiotalar plating. The more lateral of these 2 screws has a gap in the screw of about 4.5 mm indicating failure. The proximal 1.1 cm fragment of the more medial screw has migrated proximally and eroded into the anterior tibial rim as shown for example on image 46/9. Large posterior and small anterior tibiotalar joint effusion. Os trigonum noted. There are also 2 longitudinal threaded screws in the medullary space of the talus, I suspect that these originally extended through the navicular as well but no longer. There is  notable degenerative hindfoot arthropathy with loss of articular space and considerable spurring along the margins of the tibiotalar joint and along the subtalar joints. Elongated spur like projection from the navicular extends towards the anterior process of the calcaneus and could be from proliferative spurring or calcaneonavicular fibrous coalition. There is prominent spurring at the Chopart joint and moderate degenerative midfoot spurring. There is some articular space loss between the middle cuneiform and the base of the second metatarsal but otherwise the Lisfranc joint appears the metatarsals appear intact. No significant phalangeal abnormality. There is a somewhat indistinct sagittally oriented probable healing fracture plane in the upper talus, along the more medial screw track. This is shown on images 50-58 of series 9 but is not as well seen further caudad. Plantar and Achilles calcaneal spurs. Ligaments Suboptimally assessed by CT. Muscles and Tendons Scattered calcifications along the distal tibialis posterior tendon, tendinopathy is not excluded. Mild thickening of the medial band of the plantar fascia. Soft tissues Subcutaneous edema along the dorsum of the foot. IMPRESSION: 1. No gas tracking in the soft tissues of the foot, or abscess identified. Postoperative findings in the hindfoot and midfoot and across the Chopart joint 2. Hardware failure in the talar screws associated with the dorsal talonavicular plate. Both screws are fractured, and the proximal 1.1 cm fragment of the more medial screw has migrated proximally to erode into the anterior tibial rim. There is a large tibiotalar joint effusion. 3. No obvious bony destructive findings to suggest osteomyelitis. There is sclerosis at the talonavicular articulation but this is thought to probably be due to severe degenerative findings. 4. The navicular extends towards the anterior process of the calcaneus, an appearance which could be seen in the  setting of fibrous tarsal coalition but might also be from proliferative spurring. 5. Dorsal subcutaneous edema in the foot, cellulitis is not excluded. 6. Plantar and Achilles calcaneal spurs. Mild thickening of the medial band of the plantar fascia such that plantar fasciitis is not excluded. 7. Hindfoot and midfoot degenerative arthropathy. Electronically Signed   By: Gaylyn Rong M.D.   On: August 25, 2018 15:58   Ct L-spine No Charge  Result Date: 08-25-18 CLINICAL  DATA:  Sepsis. EXAM: CT LUMBAR SPINE WITHOUT CONTRAST TECHNIQUE: Multidetector CT imaging of the lumbar spine was performed without intravenous contrast administration. Multiplanar CT image reconstructions were also generated. COMPARISON:  Lumbar spine radiographs 08/10/2018 FINDINGS: Segmentation: 5 non rib-bearing lumbar type vertebral bodies are present. The lowest fully formed vertebral body is L5. S1 is partially lumbarized. Alignment: AP alignment is anatomic. Levoconvex curvature of the lumbar spine is centered at T12-L1. Vertebrae: Endplate sclerotic changes are present at L5-S1. Vertebral body heights are normal. No acute or healing fractures are present. Paraspinal and other soft tissues: Atherosclerotic changes are present at the aortic arch. There is no aneurysm. No solid organ lesions are present. Disc levels: T12-L1: Negative. L1-2: Negative. L2-3: Mild facet hypertrophy is worse on the left. Mild foraminal narrowing is evident bilaterally. L3-4: A mild broad-based disc protrusion is present. Moderate facet hypertrophy is noted bilaterally. Mild central and moderate bilateral foraminal stenosis is present. L4-5: A broad-based disc protrusion is asymmetric to the right. Moderate facet hypertrophy is worse on the right. Severe right and moderate left foraminal stenosis is present. L5-S1: A broad-based disc protrusion is present. Advanced facet hypertrophy is noted bilaterally. Moderate central canal stenosis is present. There is  severe osseous foraminal narrowing bilaterally. IMPRESSION: 1. 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 2. Mild foraminal narrowing bilaterally at L2-3. 3. Mild central and moderate bilateral foraminal stenosis at L3-4. 4. Severe right and moderate left foraminal stenosis at L4-5 with moderate central canal stenosis worse on the right. 5. Moderate central and severe foraminal stenosis bilaterally at L5-S1. Electronically Signed   By: Marin Roberts M.D.   On: 24-Aug-2018 15:38   Dg Chest Portable 1 View  Result Date: 08/24/2018 CLINICAL DATA:  Intubation, endotracheal tube placement EXAM: PORTABLE CHEST 1 VIEW COMPARISON:  Portable exam 0728 hours without priors for comparison FINDINGS: Tip of endotracheal tube projects 6.8 cm above carina. Nasogastric tube extends into stomach. Upper normal heart size. BILATERAL airspace infiltrates greater on RIGHT. No pleural effusion or pneumothorax. Bones unremarkable. IMPRESSION: BILATERAL pulmonary infiltrates RIGHT greater than LEFT most consistent with pneumonia. Electronically Signed   By: Ulyses Southward M.D.   On: 2018-08-24 07:55   Dg Chest Port 1v Same Day  Result Date: 2018-08-24 CLINICAL DATA:  Intubated EXAM: PORTABLE CHEST 1 VIEW COMPARISON:  Chest radiograph from earlier today. FINDINGS: Endotracheal tube tip is 6.3 cm above the carina. Enteric tube enters stomach with the tip not seen on this image. Stable cardiomediastinal silhouette with normal heart size. No pneumothorax. Small right pleural effusion is stable. No left pleural effusion. Extensive patchy opacities throughout the right greater than left lungs, unchanged. IMPRESSION: 1.  Endotracheal tube tip is 6.3 cm above the carina. 2. Enteric tube enters stomach with the tip not seen on this image. 3. Stable small right pleural effusion. 4. Stable extensive patchy opacities throughout the right greater than left lungs, suggesting multilobar pneumonia. Electronically  Signed   By: Delbert Phenix M.D.   On: 24-Aug-2018 09:21    Microbiology Recent Results (from the past 240 hour(s))  Urine culture     Status: Abnormal (Preliminary result)   Collection Time: 08-24-2018  6:55 AM  Result Value Ref Range Status   Specimen Description   Final    URINE, CATHETERIZED Performed at [facility], [address], [place], [place] [address]    Special Requests   Final    NONE Performed at [facility], [address], [place], [place]  [address]    Culture 80,000 COLONIES/mL STAPHYLOCOCCUS AUREUS (A)  Final   Report Status PENDING  Incomplete  Blood culture (routine x 2)     Status: Abnormal (Preliminary result)   Collection Time: 2018-09-09  6:55 AM  Result Value Ref Range Status   Specimen Description   Final    BLOOD FEMORAL ARTERY Performed at [facility], [address], [place], [place] [address]    Special Requests   Final    BOTTLES DRAWN AEROBIC ONLY Blood Culture adequate volume Performed at [facility], [address] [place], [place] [address]    Culture  Setup Time   Final    GRAM POSITIVE COCCI AEROBIC BOTTLE Gram Stain Report Called to,Read Back By and Verified With: GOLDSMITH,A ([email or web address])@1650  BY MATTHEWS, B September 09, 2018 [facility] Performed at [facility], [address] [place], [place] [address]    Culture (A)  Final    STAPHYLOCOCCUS AUREUS SUSCEPTIBILITIES TO FOLLOW Performed at [facility], [address] [place], [place] [address]    Report Status PENDING  Incomplete  Blood culture (routine x 2)     Status: Abnormal (Preliminary result)   Collection Time: 09/09/18  7:17 AM  Result Value Ref Range Status   Specimen Description   Final    BLOOD NECK Performed at [facility], [address] [place], [place] [address]    Special Requests   Final    BOTTLES DRAWN AEROBIC ONLY Blood Culture adequate volume Performed at [facility], [address] [place], [place] [address]    Culture  Setup Time   Final    GRAM  POSITIVE COCCI AEROBIC BOTTLE Gram Stain Report Called to,Read Back By and Verified With: GOLDSMITH,A ([place]) @1650  BY MATTHEWS, B 09/09/2018 [facility] PATIENT DISCHARGED OR EXPIRED PATIENT DECEASED    Culture (A)  Final    STAPHYLOCOCCUS AUREUS SUSCEPTIBILITIES TO FOLLOW Performed at [facility], [address], [place], [place] [address]    Report Status PENDING  Incomplete  Blood Culture ID Panel (Reflexed)     Status: Abnormal   Collection Time: Sep 09, 2018  7:17 AM  Result Value Ref Range Status   Enterococcus species NOT DETECTED NOT DETECTED Final   Vancomycin resistance NOT DETECTED NOT DETECTED Final   Listeria monocytogenes NOT DETECTED NOT DETECTED Final   Staphylococcus species DETECTED (A) NOT DETECTED Final    Comment: PATIENT DISCHARGED OR EXPIRED PATIENT DECEASED    Staphylococcus aureus ([facility]) DETECTED (A) NOT DETECTED Final    Comment: PATIENT DISCHARGED OR EXPIRED PATIENT DECEASED Methicillin (oxacillin)-resistant Staphylococcus aureus (MRSA). MRSA is predictably resistant to beta-lactam antibiotics (except ceftaroline). Preferred therapy is vancomycin unless clinically contraindicated. Patient requires contact precautions if  hospitalized.    Methicillin resistance DETECTED (A) NOT DETECTED Final    Comment: PATIENT DISCHARGED OR EXPIRED PATIENT DECEASED    Streptococcus species NOT DETECTED NOT DETECTED Final   Streptococcus agalactiae NOT DETECTED NOT DETECTED Final   Streptococcus pneumoniae NOT DETECTED NOT DETECTED Final   Streptococcus pyogenes NOT DETECTED NOT DETECTED Final   Acinetobacter baumannii NOT DETECTED NOT DETECTED Final   Enterobacteriaceae species NOT DETECTED NOT DETECTED Final   Enterobacter cloacae complex NOT DETECTED NOT DETECTED Final   Escherichia coli NOT DETECTED NOT DETECTED Final   Klebsiella oxytoca NOT DETECTED NOT DETECTED Final   Klebsiella pneumoniae NOT DETECTED NOT DETECTED Final   Proteus species NOT  DETECTED NOT DETECTED Final   Serratia marcescens NOT DETECTED NOT DETECTED Final  Carbapenem resistance NOT DETECTED NOT DETECTED Final   Haemophilus influenzae NOT DETECTED NOT DETECTED Final   Neisseria meningitidis NOT DETECTED NOT DETECTED Final   Pseudomonas aeruginosa NOT DETECTED NOT DETECTED Final   Candida albicans NOT DETECTED NOT DETECTED Final   Candida glabrata NOT DETECTED NOT DETECTED Final   Candida krusei NOT DETECTED NOT DETECTED Final   Candida parapsilosis NOT DETECTED NOT DETECTED Final   Candida tropicalis NOT DETECTED NOT DETECTED Final    Comment: Performed at [facility], [address] [place], [place] [address]  MRSA PCR Screening     Status: Abnormal   Collection Time: 2018/08/30 10:37 AM  Result Value Ref Range Status   MRSA by PCR POSITIVE (A) NEGATIVE Final    Comment:        The GeneXpert MRSA Assay (FDA approved for NASAL specimens only), is one component of a comprehensive MRSA colonization surveillance program. It is not intended to diagnose MRSA infection nor to guide or monitor treatment for MRSA infections. RESULT CALLED TO, READ BACK BY AND VERIFIED WITH: Earnstine Regal RN 13:15 Aug 30, 2018 ([email or web address]) Performed at [facility], [address] [place], [place] [address]     Lab Basic Metabolic Panel: Recent Labs  Lab 2018/08/30 0655 August 30, 2018 0702 08/30/2018 1245  NA 135 132* 133*  K 5.4* 5.6* 7.3*  CL 99 106 98  CO2 <7*  --  <7*  GLUCOSE 23* <20* 329*  BUN 78* 96* 76*  CREATININE 5.82* 6.00* 5.93*  CALCIUM 10.0  --  7.9*  MG  --   --  3.9*   Liver Function Tests: Recent Labs  Lab 30-Aug-2018 0655 08/30/2018 1245  AST 76* 169*  ALT 22 32  ALKPHOS 662* 802*  BILITOT 6.1* 4.2*  PROT 7.5 5.0*  ALBUMIN 1.6* <1.0*   Recent Labs  Lab Aug 30, 2018 1245  LIPASE 66*   No results for input(s): AMMONIA in the last 168 hours. CBC: Recent Labs  Lab 08/30/2018 0655 Aug 30, 2018 0702 August 30, 2018 1109 August 30, 2018 1202  WBC 55.4*  --   --   46.0*  HGB 13.2 15.6  --  9.6*  HCT 46.6 46.0  --  34.7*  MCV 94.1  --   --  96.9  PLT 67*  --  36* 35*   Cardiac Enzymes: Recent Labs  Lab 08/30/2018 1248  TROPONINI 0.45*   Sepsis Labs: Recent Labs  Lab 08/30/18 0655 2018-08-30 0702 08-30-18 1100 30-Aug-2018 1202 Aug 30, 2018 1248  PROCALCITON  --   --   --   --  7.75  WBC 55.4*  --   --  46.0*  --   LATICACIDVEN  --  >17.00* 23.1*  --   --     Procedures/Operations  Mechanical ventilation. Arterial line placement Hemodialysis catheter line placement   Natarsha Hurwitz 08/23/2018, 11:49 AM

## 2018-09-04 NOTE — ED Notes (Signed)
CRITICAL VALUE ALERT  Critical Value:  cbg 14  Date & Time Notied:  0723  Provider Notified: Dr Judd Lienelo  Orders Received/Actions taken: orders received,

## 2018-09-04 NOTE — Progress Notes (Signed)
Family grieving. This is 3rd child of 4 who have died from parents of patient. Patient leaves behind a young daughter-.  Family went out for the tubes and other things to be removed.  One son remains living-brother of patient Stephen BoltRicky Barton.   Had prayer with family-at beginning before patient declared dead.  Spent time with them while waiting for patient to be made more presentable.  I heard a lt of blaming self talk from mother.  I did tell family that Hospice has great bereavement care available and suggested they check it out if they want to that it can be very helpful and is free.      06/06/2018 1700  Clinical Encounter Type  Visited With Patient and family together  Visit Type Death  Referral From Nurse  Consult/Referral To Chaplain  Spiritual Encounters  Spiritual Needs Prayer;Emotional;Other (Comment) (Get next of kin info for nurse)  Stress Factors  Family Stress Factors Loss   Next of kin  Mother [name] [phone], [place] [id number] [name])  [facility] [place], [place], [address], [place], [place] [id number] Phone [name], [place]

## 2018-09-04 NOTE — ED Notes (Signed)
CRITICAL VALUE ALERT  Critical Value:  Glucose 20  Date & Time Notied:  02/22/18, [id number]  Provider Notified: Dr. Judd Lienelo  Orders Received/Actions taken: amp of D50

## 2018-09-04 NOTE — ED Notes (Signed)
Pt agitated, unable to be redirected, Dr Judd Lienelo at bedside,

## 2018-09-04 NOTE — ED Provider Notes (Signed)
[facility] EMERGENCY DEPARTMENT Provider Note   CSN: [id number] Arrival date & time: 05-Sep-2018  0620     History   Chief Complaint Chief Complaint  Patient presents with  . Altered Mental Status    HPI Stephen Barton is a 47 y.o. male.  Patient is a 47 year old male with past medical history of hypertension and sleep apnea.  He also has a history of degenerative disc disease with infected hardware.  He presents today by EMS for evaluation of mental status change.  According to the family yesterday he appeared more confused and disoriented.  EMS was summoned to the home yesterday, however the patient declined transport.  Throughout the night his confusion became worse and his speech is now incoherent.  The patient adds no additional history secondary to acuity of condition.  The history is provided by the EMS personnel.  Altered Mental Status   This is a new problem. The current episode started yesterday. The problem has been rapidly worsening. Associated symptoms include confusion and agitation.    Past Medical History:  Diagnosis Date  . Hypertension   . Sleep apnea     Patient Active Problem List   Diagnosis Date Noted  . SOB (shortness of breath) 02/13/2016    Past Surgical History:  Procedure Laterality Date  . FOOT SURGERY     X's        Home Medications    Prior to Admission medications   Medication Sig Start Date End Date Taking? Authorizing Provider  aspirin (ASPIRIN EC) 81 MG EC tablet Take 81 mg by mouth daily. Swallow whole.    [provider]  diclofenac (VOLTAREN) 75 MG EC tablet Take 75 mg by mouth 2 (two) times daily.    [provider]  FLUoxetine (PROZAC) 20 MG capsule Take 20 mg by mouth daily.    [provider]  furosemide (LASIX) 40 MG tablet Take 40 mg by mouth daily as needed.    [provider]  gabapentin (NEURONTIN) 300 MG capsule Take 300 mg by mouth 3 (three) times daily.    [provider]   losartan (COZAAR) 50 MG tablet Take 1.5 tablets (75 mg total) by mouth daily. 02/13/16   Laqueta Linden, MD  methadone (DOLOPHINE) 10 MG/5ML solution Take 160 mg by mouth daily.    [provider]  potassium chloride (K-DUR) 10 MEQ tablet Take 10 mEq by mouth daily as needed.    [provider]  sildenafil (VIAGRA) 100 MG tablet Take 100 mg by mouth daily as needed for erectile dysfunction.    [provider]    Family History Family History  Problem Relation Age of Onset  . Lung cancer Father   . Diabetes Father   . Diabetes Mother   . Heart attack Mother        x's 2  . Hypertension Mother   . Hyperlipidemia Mother   . Heart disease Maternal Grandmother   . Diabetes Maternal Grandmother     Social History Social History   Tobacco Use  . Smoking status: Never Smoker  . Smokeless tobacco: Never Used  Substance Use Topics  . Alcohol use: Not on file  . 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   Review of Systems Review of Systems  Unable to perform ROS: Acuity  of condition  Psychiatric/Behavioral: Positive for agitation and confusion.     Physical Exam Updated Vital Signs BP (!) 170/104   Pulse (!) 132   Resp (!) 35   SpO2 (!) 84%   Physical Exam  Constitutional: He appears well-developed and well-nourished. He appears distressed.  Patient is an acutely ill-appearing 47 year old male.  He is in severe respiratory distress.  He is confused, moaning, and speech is incomprehensible.  HENT:  Head: Normocephalic and atraumatic.  Mouth/Throat: Oropharynx is clear and moist.  Eyes: Pupils are equal, round, and reactive to light. EOM are normal.  Neck: Normal range of motion. Neck supple.  Cardiovascular: Normal rate and regular rhythm. Exam reveals no friction rub.  No murmur heard. Pulmonary/Chest: Effort  normal and breath sounds normal. No respiratory distress. He has no wheezes. He has no rales.  Abdominal: Soft. Bowel sounds are normal. He exhibits no distension. There is no tenderness.  Musculoskeletal: Normal range of motion. He exhibits no edema.  Neurological:  Patient's mental status is altered.  He is agitated and moaning. Speech is difficult to comprehend.  He moves all four extremities, but not to command.  Skin: He is diaphoretic. There is pallor.  Skin is cool to the touch with peripheral cyanosis and mottling.  Nursing note and vitals reviewed.    ED Treatments / Results  Labs (all labs ordered are listed, but only abnormal results are displayed) Labs Reviewed  URINE CULTURE  CULTURE, BLOOD (ROUTINE X 2)  CULTURE, BLOOD (ROUTINE X 2)  URINALYSIS, ROUTINE W REFLEX MICROSCOPIC  COMPREHENSIVE METABOLIC PANEL  CBC WITH DIFFERENTIAL/PLATELET  I-STAT TROPONIN, ED  I-STAT CG4 LACTIC ACID, ED    EKG EKG Interpretation  Date/Time:  Monday August 22 2018 07:34:30 EST Ventricular Rate:  117 PR Interval:    QRS Duration: 96 QT Interval:  312 QTC Calculation: 436 R Axis:   28 Text Interpretation:  Atrial flutter Ventricular premature complex Repol abnrm, severe global ischemia (LM/MVD) Confirmed by Geoffery LyonseLo, Daviona Herbert ([id number]) on 2018/07/19 7:54:10 AM   Radiology No results found.  Procedures Procedures (including critical care time)  Medications Ordered in ED Medications  propofol (DIPRIVAN) 10 mg/mL bolus/IV push (has no administration in time range)  propofol (DIPRIVAN) 1000 MG/100ML infusion (has no administration in time range)  etomidate (AMIDATE) injection (20 mg Intravenous Given 10-Jul-2018 0635)  succinylcholine (ANECTINE) injection (200 mg Intravenous Given 10-Jul-2018 0635)  rocuronium (ZEMURON) injection (40 mg Intravenous Given 10-Jul-2018 [id number])     Initial Impression / Assessment and Plan / ED Course  I have reviewed the triage vital signs and the nursing  notes.  Pertinent labs & imaging results that were available during my care of the patient were reviewed by me and considered in my medical decision making (see chart for details).  This patient is a 47 year old male with past medical history of hypertension and sleep apnea.  He also has a history of back surgery with some sort of hardware failure or infection in the past.  He presents for a 2-day history of altered mental status.  According to family members, he was awake screaming in pain all night, then this morning became more agitated, confused, and the family could not understand his speech.  On arrival to the ER, the patient appeared extremely, acutely ill.  His torso was mottled and extremities were cool and cyanotic.  Vital signs were initially stable, however the patient was extremely agitated and could not be redirected.  Multiple attempts were made to establish IV  access, however the patient's peripheral vasculature was clamped down and this was difficult to establish.  Once IV access was established, RSI was performed.  He was initially given 20 mg of etomidate along with 200 mg of succinyl choline with no effect.  He was then given rocuronium with eventual paralysis.  A 7.5 endotracheal tube was placed using the glide scope.  Tube placement was confirmed with direct visualization, end-tidal CO2, and auscultation over the stomach and lungs.  Laboratory studies were obtained through a femoral stick.  Cultures of the blood and urine were obtained.  Results were profoundly abnormal showing multiorgan failure.  His creatinine is 6, LFTs are elevated, and chest x-ray shows what I believe is a concern for ARDS.  Patient was given vancomycin and Zosyn along with appropriate volume of normal saline.  He was found to be hypoglycemic and required recurrent doses of D50.  He was also given several doses of bicarb as his pH was undetectable.  White cell count resulted greater than 50.  This patient  is extremely ill and I suspect likely will not survive.  I did speak with the patient's acquaintance who is his roommate.  I informed her of the severity of his illness and updated her on his condition.  I have spoken with Dr. Denese Killings from critical care who accepts the patient in transfer to [facility].  CRITICAL CARE Performed by: Geoffery Lyons Total critical care time: 90 minutes Critical care time was exclusive of separately billable procedures and treating other patients. Critical care was necessary to treat or prevent imminent or life-threatening deterioration. Critical care was time spent personally by me on the following activities: development of treatment plan with patient and/or surrogate as well as nursing, discussions with consultants, evaluation of patient's response to treatment, examination of patient, obtaining history from patient or surrogate, ordering and performing treatments and interventions, ordering and review of laboratory studies, ordering and review of radiographic studies, pulse oximetry and re-evaluation of patient's condition.   Final Clinical Impressions(s) / ED Diagnoses   Final diagnoses:  None    ED Discharge Orders    None       Geoffery Lyons, MD 08/29/2018 [phone]

## 2018-09-04 DEATH — deceased

## 2020-05-21 IMAGING — CT CT CERVICAL SPINE W/O CM
3 of 6 series · 13 of 33 positions shown, 15 images · non-contrast
Comparison: None.

CLINICAL DATA: Confusion.

EXAM:
CT HEAD WITHOUT CONTRAST
CT CERVICAL SPINE WITHOUT CONTRAST
TECHNIQUE: Multidetector CT imaging of the head and cervical spine was
performed following the standard protocol without intravenous
contrast. Multiplanar CT image reconstructions of the cervical spine
were also generated.

[Series 8: c_spine 2.0 st · axial · 0.36mm/px · z∈[-322,-110]mm · 8 of 133 slices shown, 10 images]
[im 14/133  soft-tissue]
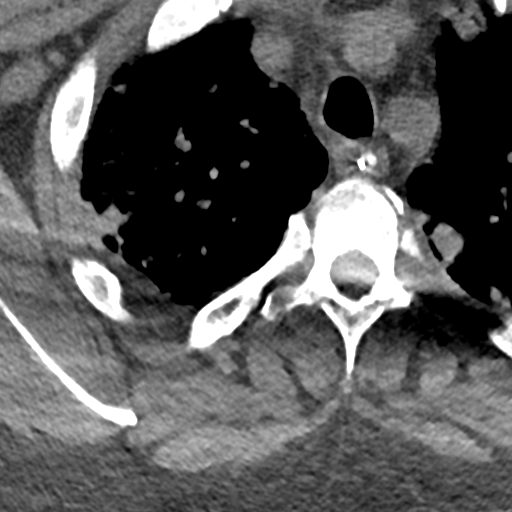
[im 14/133  bone]
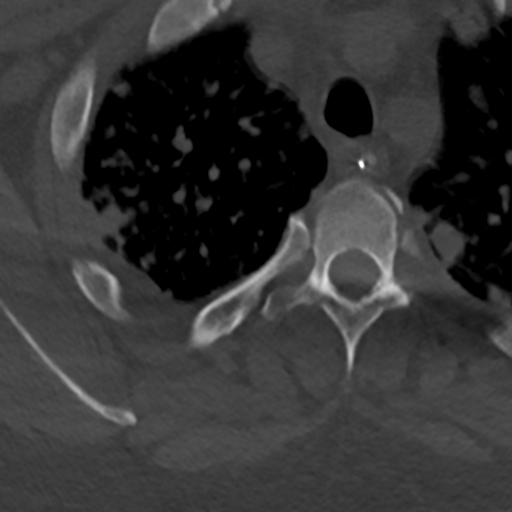
[im 27/133  bone]
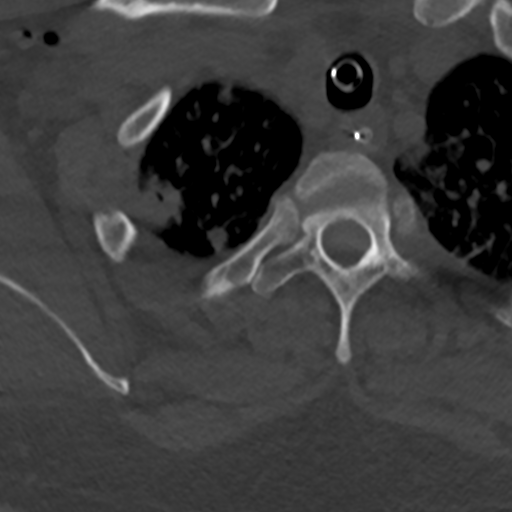
[im 40/133  bone]
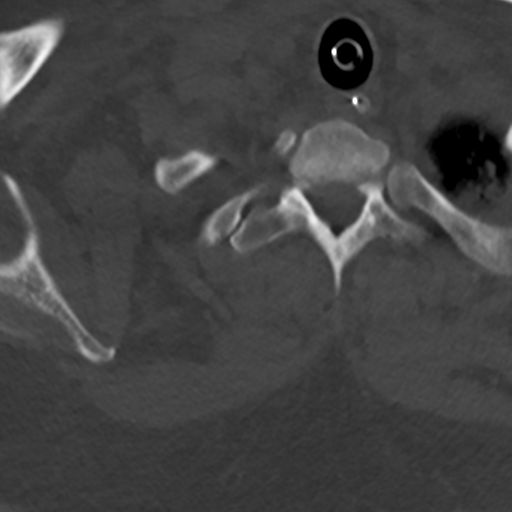
[im 53/133  bone]
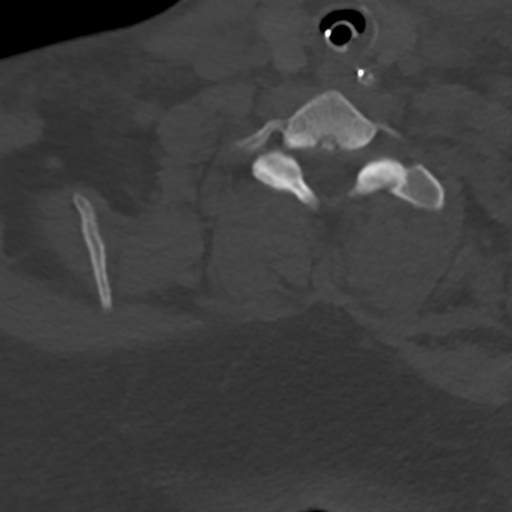
[im 80/133  soft-tissue]
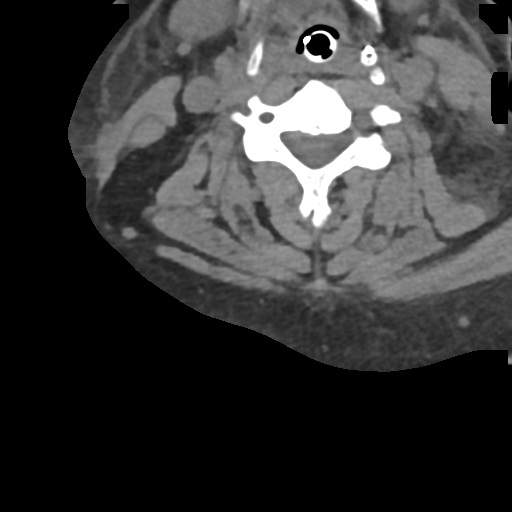
[im 80/133  bone]
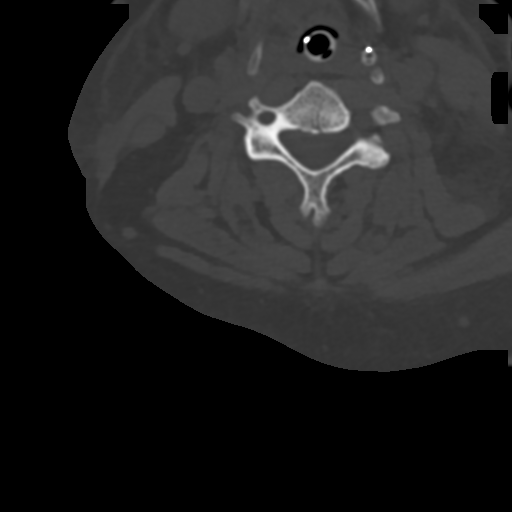
[im 93/133  bone]
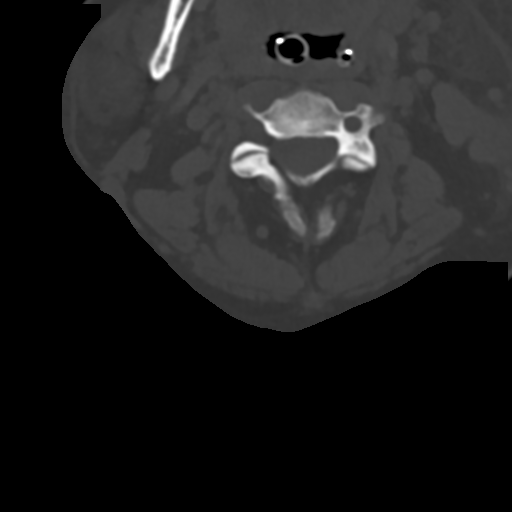
[im 106/133  bone]
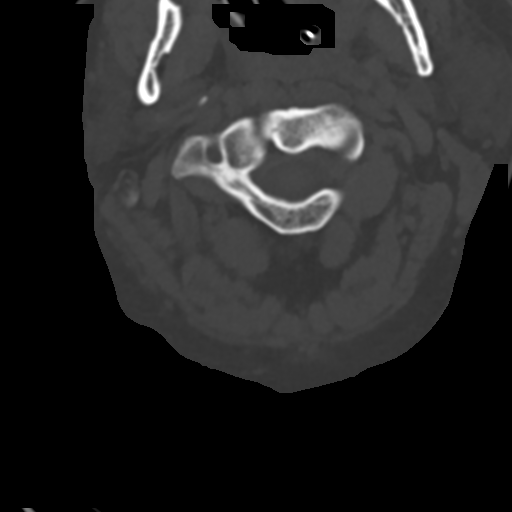
[im 119/133  bone]
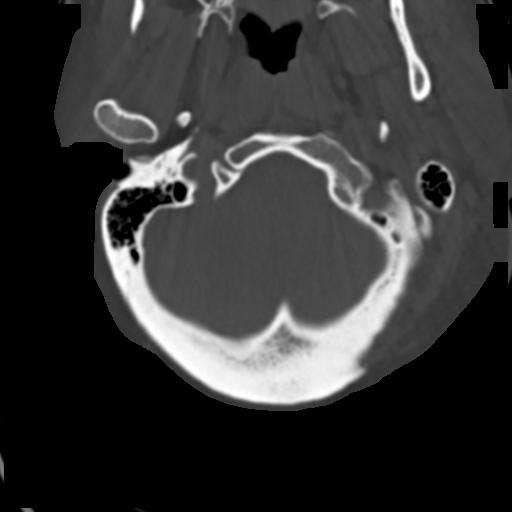

[Series 11: sagittal bone · sagittal · 0.42mm/px · 4 of 61 slices shown]
[im 13/61  bone]
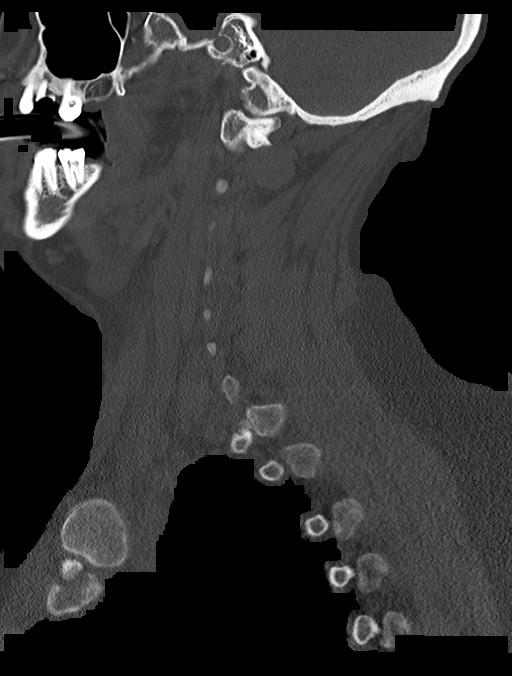
[im 25/61  bone]
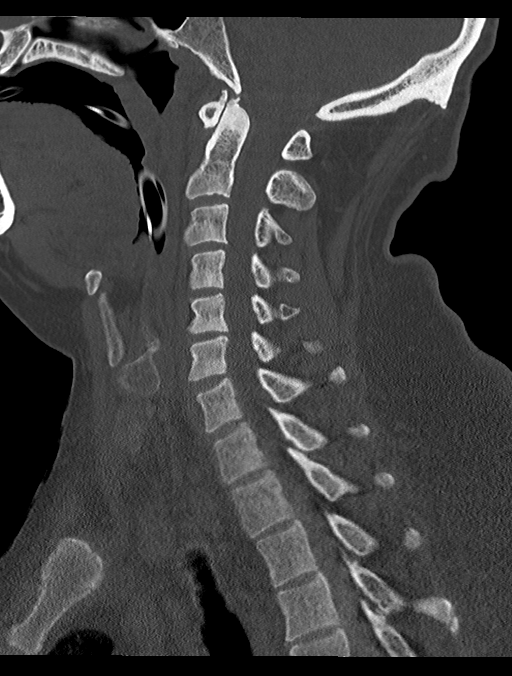
[im 37/61  bone]
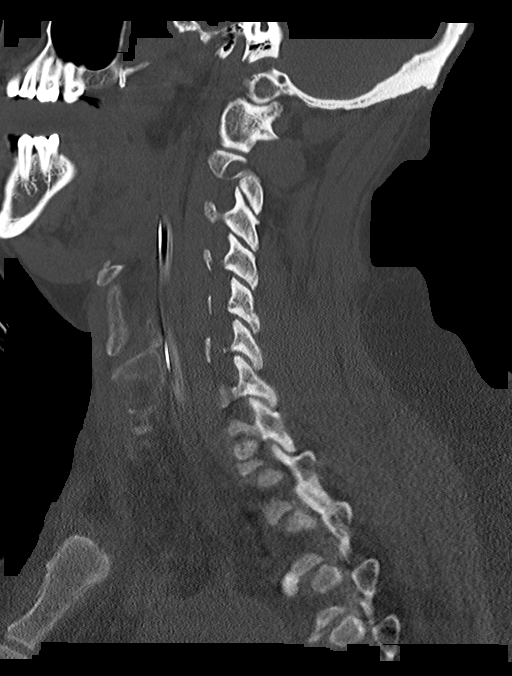
[im 49/61  bone]
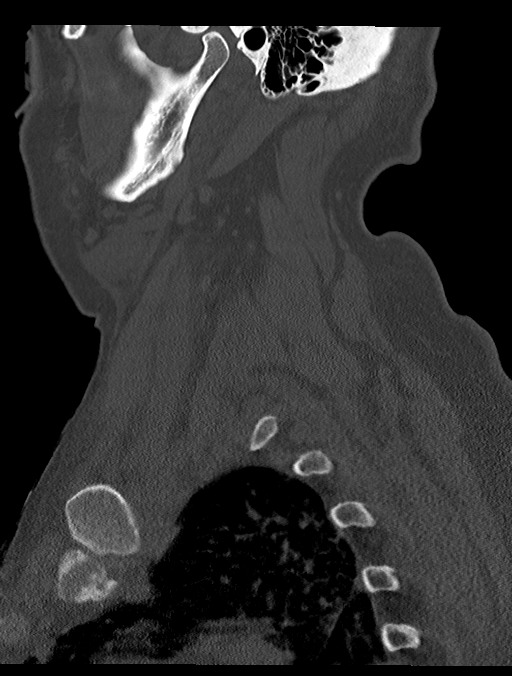

[Series 12: coronal bone · coronal · 0.27mm/px · 1 of 61 slices shown]
[im 31/61  bone]
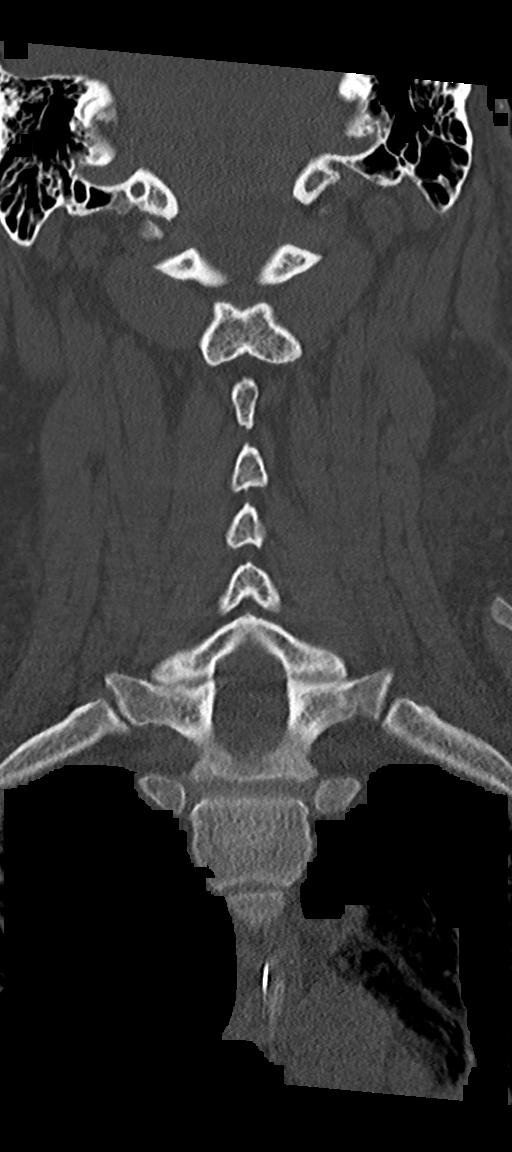

[13 of 33 positions shown; findings below may reference images not displayed]

FINDINGS: CT HEAD FINDINGS

Brain: Right parietal low density is noted concerning for infarction
of indeterminate age or possibly edema related underlying neoplasm.
No midline shift is noted. Ventricular size is within normal limits.
There is no evidence of hemorrhage.

Vascular: No hyperdense vessel or unexpected calcification.

Skull: Normal. Negative for fracture or focal lesion.

Sinuses/Orbits: No acute finding.

Other: None.

CT CERVICAL SPINE FINDINGS

Alignment: Normal.

Skull base and vertebrae: No acute fracture. No primary bone lesion
or focal pathologic process.

Soft tissues and spinal canal: No prevertebral fluid or swelling. No
visible canal hematoma.

Disc levels: Mild degenerative disc disease is noted at C5-6 with
anterior and posterior osteophyte formation.

Upper chest: Multifocal opacities are noted in visualized lung
apices concerning for multifocal pneumonia or septic emboli.

Other: None.
IMPRESSION: Right parietal low density is noted concerning for infarction of
indeterminate age or possibly edema related underlying neoplasm. MRI
is recommended for further evaluation.

Mild degenerative disc disease is noted at C5-6. No fracture or
spondylolisthesis is noted.

Multifocal opacities are noted in the visualized portions of lung
apices concerning for multifocal pneumonia or septic emboli. Please
see report of chest CT performed on same day.
